# Patient Record
Sex: Female | Born: 1955 | Race: Black or African American | Hispanic: No | Smoking: Former smoker
Health system: Southern US, Community
[De-identification: ages and names within clinical notes are randomized; demographics above are authoritative.]

## PROBLEM LIST (undated history)

## (undated) DIAGNOSIS — I639 Cerebral infarction, unspecified: Secondary | ICD-10-CM

## (undated) HISTORY — PX: PERCUTANEOUS ENDOSCOPIC GASTROSTOMY (PEG) REMOVAL: SHX6018

## (undated) HISTORY — PX: BREAST BIOPSY: SHX20

## (undated) HISTORY — DX: Cerebral infarction, unspecified: I63.9

## (undated) HISTORY — PX: TUBAL LIGATION: SHX77

## (undated) HISTORY — PX: PATELLA FRACTURE SURGERY: SHX735

## (undated) HISTORY — PX: CRANIECTOMY: SHX331

---

## 2000-10-03 HISTORY — PX: ORTHOPEDIC SURGERY: SHX850

## 2014-05-06 LAB — HM PAP SMEAR: HM Pap smear: NEGATIVE

## 2017-02-16 HISTORY — PX: CRANIOTOMY FOR HEMISPHERECTOMY TOTAL / PARTIAL: SUR335

## 2017-08-08 HISTORY — PX: CRANIOPLASTY: SUR330

## 2018-03-21 LAB — BASIC METABOLIC PANEL
BUN: 12 (ref 4–21)
Creatinine: 0.8 (ref 0.5–1.1)
Potassium: 4.4 (ref 3.4–5.3)
Sodium: 142 (ref 137–147)

## 2018-03-21 LAB — CBC AND DIFFERENTIAL
HCT: 39 (ref 36–46)
Hemoglobin: 12.3 (ref 12.0–16.0)
Platelets: 300 (ref 150–399)
WBC: 7.5

## 2018-03-26 LAB — BASIC METABOLIC PANEL: Glucose: 94

## 2018-10-01 ENCOUNTER — Encounter: Payer: Self-pay | Admitting: Family Medicine

## 2018-10-01 ENCOUNTER — Ambulatory Visit: Payer: Self-pay | Admitting: Family Medicine

## 2018-10-01 VITALS — BP 138/88 | HR 83 | Temp 97.6°F | Ht 65.0 in | Wt 111.5 lb

## 2018-10-01 DIAGNOSIS — G8190 Hemiplegia, unspecified affecting unspecified side: Secondary | ICD-10-CM | POA: Insufficient documentation

## 2018-10-01 DIAGNOSIS — E119 Type 2 diabetes mellitus without complications: Secondary | ICD-10-CM

## 2018-10-01 DIAGNOSIS — R531 Weakness: Secondary | ICD-10-CM

## 2018-10-01 DIAGNOSIS — Z8673 Personal history of transient ischemic attack (TIA), and cerebral infarction without residual deficits: Secondary | ICD-10-CM

## 2018-10-01 DIAGNOSIS — I10 Essential (primary) hypertension: Secondary | ICD-10-CM

## 2018-10-01 DIAGNOSIS — I152 Hypertension secondary to endocrine disorders: Secondary | ICD-10-CM | POA: Insufficient documentation

## 2018-10-01 DIAGNOSIS — H00011 Hordeolum externum right upper eyelid: Secondary | ICD-10-CM

## 2018-10-01 DIAGNOSIS — Z7189 Other specified counseling: Secondary | ICD-10-CM | POA: Insufficient documentation

## 2018-10-01 DIAGNOSIS — I69320 Aphasia following cerebral infarction: Secondary | ICD-10-CM | POA: Insufficient documentation

## 2018-10-01 DIAGNOSIS — E1169 Type 2 diabetes mellitus with other specified complication: Secondary | ICD-10-CM | POA: Insufficient documentation

## 2018-10-01 NOTE — Assessment & Plan Note (Signed)
Brief discussion regarding wishes for cancer screening. Declined cervical cancer, breast cancer, and colon cancer screening. Will plan to make sure patient has an advanced directive and continue the discussion at next office visit.

## 2018-10-01 NOTE — Assessment & Plan Note (Signed)
BP ok today. Will reviewed neurology notes and prior PCP. May need to have stricter goal.

## 2018-10-01 NOTE — Assessment & Plan Note (Signed)
Stroke with lingering Right UE weakness and RLE requiring brace. And expressive aphasia. Will follow-up outside records.

## 2018-10-01 NOTE — Progress Notes (Signed)
Subjective:     Andrea [name] is a 62 y.o. female presenting for Establish Care (previous P[place] Josepha [place]. last visit there was in July 2019.) and Stye (right eye. better than 2 weeks ago. has applied ointment to the area.)     HPI  #Hx of CVA - Feb 13, 2017 - Lives with daughter - minimal movement in right arm - dysphagia/aphasia - right brace on right leg - using cane to walk - is on amantadine which has helped with aphasia - communication is improving through   Was on warfarin initially - though complicated by vaginal bleeding - procedure by OB/GYN  Had a neurologist for a few months -- final appointment was on as needed bases -- 25% damage on the left side  Seems to be more tired over the last week Did not want to get out bed  Gave her the stool softener - started to feel better after going to the bathroom  Is on a more regular diet now   Review of Systems  Respiratory: Negative for choking and shortness of breath.   Psychiatric/Behavioral: Negative for dysphoric mood. The patient is not nervous/anxious.      Social History   Tobacco Use  Smoking Status Former Smoker  . Last attempt to quit: 12/2016  . Years since quitting: 1.8  Smokeless Tobacco Never Used        Objective:    BP Readings from Last 3 Encounters:  10/01/18 138/88   Wt Readings from Last 3 Encounters:  10/01/18 111 lb 8 oz (50.6 kg)    BP 138/88   Pulse 83   Temp 97.6 F (36.4 C)   Ht 5\' 5"  (1.651 m)   Wt 111 lb 8 oz (50.6 kg)   SpO2 98%   BMI 18.55 kg/m    Physical Exam Constitutional:      Appearance: She is normal weight.  HENT:     Head:     Comments: Head healed though with asymmetric shape in setting of craniotomy     Nose: Nose normal.     Mouth/Throat:     Mouth: Mucous membranes are moist.  Eyes:     Conjunctiva/sclera: Conjunctivae normal.     Comments: Right upper lid with area of fullness. Not tender to palpation  Cardiovascular:    Rate and Rhythm: Normal rate and regular rhythm.     Heart sounds: No murmur.  Pulmonary:     Effort: Pulmonary effort is normal.     Breath sounds: Normal breath sounds.  Neurological:     Mental Status: She is alert. Mental status is at baseline.     Comments: Right arm with partially closed fist and resting at side. She is able to lift the shoulder partially. But unable to close/open fist or straighten arm. RLE with good knee flexion/extension 4/5 to 5/5 with resistance. Normal strength of LUE  Psychiatric:     Comments: Answers questions with nods.            Assessment & Plan:   Problem List Items Addressed This Visit      Cardiovascular and Mediastinum   Essential hypertension    BP ok today. Will reviewed neurology notes and prior PCP. May need to have stricter goal.       Relevant Medications   aspirin EC 81 MG tablet   metoprolol tartrate (LOPRESSOR) 25 MG tablet   hydrALAZINE (APRESOLINE) 10 MG tablet   amLODipine (NORVASC) 10 MG  tablet   pravastatin (PRAVACHOL) 80 MG tablet     Endocrine   Diabetes (HCC)    Per daughter last checked HgbA1c ~6 range on metformin. Continue meds, review outside records.       Relevant Medications   metFORMIN (GLUCOPHAGE) 850 MG tablet   aspirin EC 81 MG tablet   pravastatin (PRAVACHOL) 80 MG tablet     Nervous and Auditory   Right sided weakness     Other   History of stroke - Primary    Stroke with lingering Right UE weakness and RLE requiring brace. And expressive aphasia. Will follow-up outside records.       Aphasia as late effect of stroke   Counseling regarding goals of care    Brief discussion regarding wishes for cancer screening. Declined cervical cancer, breast cancer, and colon cancer screening. Will plan to make sure patient has an advanced directive and continue the discussion at next office visit.        Other Visit Diagnoses    Hordeolum externum of right upper eyelid       Relevant Orders    Ambulatory referral to Ophthalmology     Hordeolum which is not resolved in 2 weeks. Will refer for evaluation to see if removal is indicated. Continue warm compress  Return in about 3 months (around 12/31/2018).  Lynnda ChildJessica R Darrious Youman, MD

## 2018-10-01 NOTE — Assessment & Plan Note (Signed)
Per daughter last checked HgbA1c ~6 range on metformin. Continue meds, review outside records.

## 2018-10-01 NOTE — Patient Instructions (Signed)
#  Stye - continue to do Warm compresses -- try to do this 4 times a day - if not improving -- go to the eye specialist  It was great to meet you today!   I will work on getting your records and plan to see you back in about 3-4 months to check in.   Let me know if you need anything in between.

## 2018-10-08 ENCOUNTER — Telehealth: Payer: Self-pay

## 2018-10-08 NOTE — Telephone Encounter (Signed)
Greatly appreciate the support.

## 2018-10-08 NOTE — Telephone Encounter (Signed)
Spoke with patient's daughter (okay per DPR) regarding options for financial assistance.  In particular,  1.  [place] assistance program to assist with medical bill expense - reviewed process and application over phone.  Application with instructions/criteria placed in the mail for them to complete and return.  2.  [place] medication management clinic - reviewed program specifics.  Patient's daughter states that currently they are able to afford all of her medications through [facility] but will keep this option in mind if needed in the future.  3.  Discussed patients application for Medicaid which recently was denied.  Daughter had been trying to apply online on her own with much confusion and difficulty.  I gave her direct contact info for [employer] social services to connect with a Child psychotherapist who could be their advocate and help them reapply and/or look at other support options.  Daughter thanked me for all of the helpful information and has my name and number should she have any further questions.  Thanks.

## 2018-12-31 ENCOUNTER — Other Ambulatory Visit: Payer: Self-pay

## 2018-12-31 ENCOUNTER — Encounter: Payer: Self-pay | Admitting: Family Medicine

## 2018-12-31 ENCOUNTER — Ambulatory Visit (INDEPENDENT_AMBULATORY_CARE_PROVIDER_SITE_OTHER): Payer: Self-pay | Admitting: Family Medicine

## 2018-12-31 VITALS — BP 120/71 | HR 74 | Temp 98.5°F | Ht 65.0 in

## 2018-12-31 DIAGNOSIS — I10 Essential (primary) hypertension: Secondary | ICD-10-CM

## 2018-12-31 DIAGNOSIS — E119 Type 2 diabetes mellitus without complications: Secondary | ICD-10-CM

## 2018-12-31 DIAGNOSIS — Z7189 Other specified counseling: Secondary | ICD-10-CM

## 2018-12-31 DIAGNOSIS — Z8673 Personal history of transient ischemic attack (TIA), and cerebral infarction without residual deficits: Secondary | ICD-10-CM

## 2018-12-31 NOTE — Assessment & Plan Note (Signed)
Stable on oral medication. Unable to complete foot exam today. Will get labs in 3 months

## 2018-12-31 NOTE — Assessment & Plan Note (Signed)
Discussion around what she would want done if she got sick. Would like ICU care and full code. Again declined routine preventive cancer screening

## 2018-12-31 NOTE — Progress Notes (Signed)
    I connected with@ on 12/31/18 at  3:20 PM EDT by video and verified that I am speaking with the correct person using two identifiers.   I discussed the limitations, risks, security and privacy concerns of performing an evaluation and management service by video and the availability of in person appointments. I also discussed with the patient that there may be a patient responsible charge related to this service. The patient expressed understanding and agreed to proceed.  Patient location: Home Provider Location: [place] Participants: [name] and [name] and [name]   Subjective:     Andrea [name] is a 63 y.o. female presenting for Follow-up (3 months follow up.)     HPI   #Goals of Care - Would be interested - in ventilation and CPR if needed - staying inside and keeping in touch with family   #DM - Hemoglobin a1c was checked in June 2019 and was around 6   #Post Stroke - continues to work on movement - level is about the same - Walking around the house 4 times in the morning and afternoon   Review of Systems  Constitutional: Negative for chills and fever.  Eyes: Negative for visual disturbance.  Respiratory: Negative for cough and shortness of breath.   Cardiovascular: Negative for chest pain.  Endocrine: Negative for polydipsia and polyuria.  Neurological: Negative for headaches.     Social History   Tobacco Use  Smoking Status Former Smoker  . Last attempt to quit: 12/2016  . Years since quitting: 2.0  Smokeless Tobacco Never Used        Objective:   BP Readings from Last 3 Encounters:  12/31/18 120/71  10/01/18 138/88   Wt Readings from Last 3 Encounters:  10/01/18 111 lb 8 oz (50.6 kg)     BP 120/71 Comment: per patient's b/p machine at home  Pulse 74 Comment: per patient's b/p machine at home  Temp 98.5 F (36.9 C) Comment: per patient's thermometer  Ht 5\' 5"  (1.651 m)   BMI 18.55 kg/m    Right arm is  at side. Daughter responds to most questions and helps readdress question Mostly answers yes or no      Assessment & Plan:   Problem List Items Addressed This Visit      Cardiovascular and Mediastinum   Essential hypertension - Primary    BP at goal, continue current medications      Relevant Orders   Comprehensive metabolic panel   CBC     Endocrine   Diabetes (HCC)    Stable on oral medication. Unable to complete foot exam today. Will get labs in 3 months      Relevant Orders   Hemoglobin A1c     Other   History of stroke    Stable and remaining active      Relevant Orders   Hemoglobin A1c   Lipid panel   Advanced care planning/counseling discussion    Discussion around what she would want done if she got sick. Would like ICU care and full code. Again declined routine preventive cancer screening          Return in about 3 months (around 04/02/2019).  [name], MD

## 2018-12-31 NOTE — Assessment & Plan Note (Signed)
Stable and remaining active

## 2018-12-31 NOTE — Assessment & Plan Note (Signed)
BP at goal, continue current medications 

## 2019-01-07 ENCOUNTER — Encounter: Payer: Self-pay | Admitting: Family Medicine

## 2019-01-07 DIAGNOSIS — Z8709 Personal history of other diseases of the respiratory system: Secondary | ICD-10-CM | POA: Insufficient documentation

## 2019-01-07 DIAGNOSIS — J45909 Unspecified asthma, uncomplicated: Secondary | ICD-10-CM | POA: Insufficient documentation

## 2019-01-07 DIAGNOSIS — E785 Hyperlipidemia, unspecified: Secondary | ICD-10-CM | POA: Insufficient documentation

## 2019-01-07 DIAGNOSIS — E1169 Type 2 diabetes mellitus with other specified complication: Secondary | ICD-10-CM | POA: Insufficient documentation

## 2019-04-11 ENCOUNTER — Telehealth: Payer: Self-pay | Admitting: Family Medicine

## 2019-04-11 MED ORDER — PRAVASTATIN SODIUM 80 MG PO TABS: 80.0000 mg | ORAL_TABLET | Freq: Every day | ORAL | 5 refills | Status: DC

## 2019-04-11 MED ORDER — METFORMIN HCL 850 MG PO TABS: 850.0000 mg | ORAL_TABLET | Freq: Every day | ORAL | 5 refills | Status: DC

## 2019-04-11 MED ORDER — AMLODIPINE BESYLATE 10 MG PO TABS: 10.0000 mg | ORAL_TABLET | Freq: Every day | ORAL | 5 refills | Status: DC

## 2019-04-11 NOTE — Telephone Encounter (Signed)
Prescriptions refilled and appointment for follow up made. Daughter advised.

## 2019-04-11 NOTE — Telephone Encounter (Signed)
Patient's daughter called.  She called [facility] [address] to get refills on patient's medications.  They said they couldn't refill the medications because they were under her former doctor.  Patient needs a new rx for Metformin 850 mg , Pravastatin 80 mg, and Amlodipine 10 mg sent to [facility] [address]Marland KitchenMarland Kitchen

## 2019-04-15 ENCOUNTER — Ambulatory Visit: Payer: Self-pay | Admitting: Family Medicine

## 2019-04-15 ENCOUNTER — Encounter: Payer: Self-pay | Admitting: Family Medicine

## 2019-04-15 ENCOUNTER — Other Ambulatory Visit: Payer: Self-pay

## 2019-04-15 VITALS — BP 148/72 | HR 81 | Temp 98.0°F | Resp 18 | Ht 65.0 in | Wt 112.5 lb

## 2019-04-15 DIAGNOSIS — I10 Essential (primary) hypertension: Secondary | ICD-10-CM

## 2019-04-15 DIAGNOSIS — E119 Type 2 diabetes mellitus without complications: Secondary | ICD-10-CM

## 2019-04-15 DIAGNOSIS — Z8673 Personal history of transient ischemic attack (TIA), and cerebral infarction without residual deficits: Secondary | ICD-10-CM

## 2019-04-15 DIAGNOSIS — E785 Hyperlipidemia, unspecified: Secondary | ICD-10-CM

## 2019-04-15 MED ORDER — AMANTADINE HCL 100 MG PO CAPS: 100.0000 mg | ORAL_CAPSULE | Freq: Two times a day (BID) | ORAL | 3 refills | Status: DC

## 2019-04-15 MED ORDER — METOPROLOL TARTRATE 25 MG PO TABS: 12.5000 mg | ORAL_TABLET | Freq: Two times a day (BID) | ORAL | 3 refills | Status: DC

## 2019-04-15 MED ORDER — PRAVASTATIN SODIUM 80 MG PO TABS: 80.0000 mg | ORAL_TABLET | Freq: Every day | ORAL | 3 refills | Status: DC

## 2019-04-15 MED ORDER — METFORMIN HCL 850 MG PO TABS: 850.0000 mg | ORAL_TABLET | Freq: Every day | ORAL | 3 refills | Status: DC

## 2019-04-15 MED ORDER — AMLODIPINE BESYLATE 10 MG PO TABS: 10.0000 mg | ORAL_TABLET | Freq: Every day | ORAL | 3 refills | Status: DC

## 2019-04-15 MED ORDER — HYDRALAZINE HCL 10 MG PO TABS: 20.0000 mg | ORAL_TABLET | Freq: Three times a day (TID) | ORAL | 3 refills | Status: DC | PRN

## 2019-04-15 NOTE — Assessment & Plan Note (Signed)
BP mildly elevated today, but with good control at home. Cont current medications and home monitoring

## 2019-04-15 NOTE — Patient Instructions (Addendum)
#   Diabetes - continue metformin - labs today  # High blood pressure - keep watching at home - continue medications

## 2019-04-15 NOTE — Progress Notes (Signed)
Subjective:     Andrea [name] is a 63 y.o. female presenting for Diabetes (follow up)     HPI   #Blood pressure - check at home 128-136 - did have some pork ribs this weekend which may have   #Diabetes - taking metformin - no diarrhea to metformin - pneumonia shot during hospital  #prior stroke - still with some aphasia, but working on yes/no answers - amantadine helps with day/night regulation  -   Review of Systems  Respiratory: Negative for shortness of breath.   Cardiovascular: Negative for chest pain.  Neurological: Negative for headaches.    12/31/2018: Virtual visit - bp at goal, diabetes stable, declined cancer screening  Social History   Tobacco Use  Smoking Status Former Smoker   Quit date: 12/2016   Years since quitting: 2.3  Smokeless Tobacco Never Used        Objective:    BP Readings from Last 3 Encounters:  04/15/19 (!) 148/72  12/31/18 120/71  10/01/18 138/88   Wt Readings from Last 3 Encounters:  04/15/19 112 lb 8 oz (51 kg)  10/01/18 111 lb 8 oz (50.6 kg)    BP (!) 148/72    Pulse 81    Temp 98 F (36.7 C)    Resp 18    Ht 5\' 5"  (1.651 m)    Wt 112 lb 8 oz (51 kg)    SpO2 98%    BMI 18.72 kg/m    Physical Exam Constitutional:      General: She is not in acute distress.    Appearance: She is well-developed. She is not diaphoretic.  HENT:     Right Ear: External ear normal.     Left Ear: External ear normal.     Nose: Nose normal.  Eyes:     Conjunctiva/sclera: Conjunctivae normal.  Neck:     Musculoskeletal: Neck supple.  Cardiovascular:     Rate and Rhythm: Normal rate.  Pulmonary:     Effort: Pulmonary effort is normal.  Skin:    General: Skin is warm and dry.     Capillary Refill: Capillary refill takes less than 2 seconds.  Neurological:     Mental Status: She is alert. Mental status is at baseline.     Comments: Right arm still at side. Right leg with brace in place. Able to take off brace/shoes/socks with left  hand.    Psychiatric:        Mood and Affect: Mood normal.        Behavior: Behavior normal.    Diabetic Foot Exam - Simple   Simple Foot Form Diabetic Foot exam was performed with the following findings: Yes 04/15/2019  3:16 PM  Visual Inspection No deformities, no ulcerations, no other skin breakdown bilaterally: Yes Sensation Testing Intact to touch and monofilament testing bilaterally: Yes Pulse Check Posterior Tibialis and Dorsalis pulse intact bilaterally: Yes Comments            Assessment & Plan:   Problem List Items Addressed This Visit      Cardiovascular and Mediastinum   Essential hypertension - Primary    BP mildly elevated today, but with good control at home. Cont current medications and home monitoring      Relevant Medications   amLODipine (NORVASC) 10 MG tablet   hydrALAZINE (APRESOLINE) 10 MG tablet   metoprolol tartrate (LOPRESSOR) 25 MG tablet   pravastatin (PRAVACHOL) 80 MG tablet   Other Relevant Orders   Comprehensive metabolic panel  CBC     Endocrine   Diabetes ([place])    Per report, was improving after stroke due to weight loss. Will repeat HgbA1c today      Relevant Medications   metFORMIN (GLUCOPHAGE) 850 MG tablet   pravastatin (PRAVACHOL) 80 MG tablet   Other Relevant Orders   Hemoglobin A1c     Other   History of stroke    Cont amantadine which seems to helping with sleep/wake cycles      Relevant Medications   amantadine (SYMMETREL) 100 MG capsule   HLD (hyperlipidemia)    Will repeat lipids today. Continue statin medication      Relevant Medications   amLODipine (NORVASC) 10 MG tablet   hydrALAZINE (APRESOLINE) 10 MG tablet   metoprolol tartrate (LOPRESSOR) 25 MG tablet   pravastatin (PRAVACHOL) 80 MG tablet   Other Relevant Orders   Lipid panel       Return in about 6 months (around 10/16/2019).  Lesleigh Noe, MD

## 2019-04-15 NOTE — Assessment & Plan Note (Signed)
Will repeat lipids today. Continue statin medication

## 2019-04-15 NOTE — Assessment & Plan Note (Signed)
Per report, was improving after stroke due to weight loss. Will repeat HgbA1c today

## 2019-04-15 NOTE — Assessment & Plan Note (Signed)
Cont amantadine which seems to helping with sleep/wake cycles

## 2019-04-16 ENCOUNTER — Encounter: Payer: Self-pay | Admitting: Family Medicine

## 2019-04-16 LAB — HEMOGLOBIN A1C: Hgb A1c MFr Bld: 5.9 % (ref 4.6–6.5)

## 2019-04-16 LAB — COMPREHENSIVE METABOLIC PANEL
ALT: 13 U/L (ref 0–35)
AST: 15 U/L (ref 0–37)
Albumin: 4.7 g/dL (ref 3.5–5.2)
Alkaline Phosphatase: 54 U/L (ref 39–117)
BUN: 18 mg/dL (ref 6–23)
CO2: 31 mEq/L (ref 19–32)
Calcium: 10.3 mg/dL (ref 8.4–10.5)
Chloride: 103 mEq/L (ref 96–112)
Creatinine, Ser: 0.88 mg/dL (ref 0.40–1.20)
GFR: 78.91 mL/min (ref >60.00)
Glucose, Bld: 120 mg/dL — ABNORMAL HIGH (ref 70–99)
Potassium: 3.9 mEq/L (ref 3.5–5.1)
Sodium: 142 mEq/L (ref 135–145)
Total Bilirubin: 0.5 mg/dL (ref 0.2–1.2)
Total Protein: 7.9 g/dL (ref 6.0–8.3)

## 2019-04-16 LAB — CBC
HCT: 39.2 % (ref 36.0–46.0)
Hemoglobin: 12.5 g/dL (ref 12.0–15.0)
MCHC: 32 g/dL (ref 30.0–36.0)
MCV: 86.2 fl (ref 78.0–100.0)
Platelets: 344 10*3/uL (ref 150.0–400.0)
RBC: 4.55 Mil/uL (ref 3.87–5.11)
RDW: 14.2 % (ref 11.5–15.5)
WBC: 6.8 10*3/uL (ref 4.0–10.5)

## 2019-04-16 LAB — LIPID PANEL
Cholesterol: 139 mg/dL (ref 0–200)
HDL: 58.4 mg/dL (ref >39.00)
LDL Cholesterol: 58 mg/dL (ref 0–99)
NonHDL: 80.92
Total CHOL/HDL Ratio: 2
Triglycerides: 115 mg/dL (ref 0.0–149.0)
VLDL: 23 mg/dL (ref 0.0–40.0)

## 2019-10-17 ENCOUNTER — Ambulatory Visit (INDEPENDENT_AMBULATORY_CARE_PROVIDER_SITE_OTHER): Payer: Medicare Other | Admitting: Family Medicine

## 2019-10-17 ENCOUNTER — Other Ambulatory Visit: Payer: Self-pay

## 2019-10-17 ENCOUNTER — Encounter: Payer: Self-pay | Admitting: Family Medicine

## 2019-10-17 VITALS — BP 148/76 | HR 80 | Temp 97.1°F | Ht 65.0 in | Wt 119.2 lb

## 2019-10-17 DIAGNOSIS — E119 Type 2 diabetes mellitus without complications: Secondary | ICD-10-CM | POA: Diagnosis not present

## 2019-10-17 DIAGNOSIS — I10 Essential (primary) hypertension: Secondary | ICD-10-CM | POA: Diagnosis not present

## 2019-10-17 DIAGNOSIS — R3981 Functional urinary incontinence: Secondary | ICD-10-CM | POA: Diagnosis not present

## 2019-10-17 LAB — POCT GLYCOSYLATED HEMOGLOBIN (HGB A1C): Hemoglobin A1C: 5.4 % (ref 4.0–5.6)

## 2019-10-17 NOTE — Progress Notes (Signed)
Subjective:     Andrea [name] is a 64 y.o. female presenting for Follow-up (6 months)     HPI  #HTN - checking about 1 time a week - 118/78-80 -    Feeling fine in general  #Urine incontinence Doing more bedwetting at night Has tried to do bedside commode Has been urinating in bed daily now During the day is continent of bladder -- will get up to the bathroom - when she wakes up she is already wet - endorses waking up feeling like she needs to urinate  #Stroke - has a desk exercise and doing that a few times a week - right sided weakness   Review of Systems   Social History   Tobacco Use  Smoking Status Former Smoker  . Quit date: 12/2016  . Years since quitting: 2.8  Smokeless Tobacco Never Used        Objective:    BP Readings from Last 3 Encounters:  10/17/19 (!) 148/76  04/15/19 (!) 148/72  12/31/18 120/71   Wt Readings from Last 3 Encounters:  10/17/19 119 lb 4 oz (54.1 kg)  04/15/19 112 lb 8 oz (51 kg)  10/01/18 111 lb 8 oz (50.6 kg)    BP (!) 148/76   Pulse 80   Temp (!) 97.1 F (36.2 C)   Ht 5\' 5"  (1.651 m)   Wt 119 lb 4 oz (54.1 kg)   SpO2 98%   BMI 19.84 kg/m    Physical Exam Constitutional:      General: She is not in acute distress.    Appearance: She is well-developed. She is not diaphoretic.  HENT:     Right Ear: External ear normal.     Left Ear: External ear normal.     Nose: Nose normal.  Eyes:     Conjunctiva/sclera: Conjunctivae normal.  Cardiovascular:     Rate and Rhythm: Normal rate and regular rhythm.     Heart sounds: No murmur.  Pulmonary:     Effort: Pulmonary effort is normal. No respiratory distress.     Breath sounds: Normal breath sounds. No wheezing.  Musculoskeletal:     Cervical back: Neck supple.  Skin:    General: Skin is warm and dry.     Capillary Refill: Capillary refill takes less than 2 seconds.  Neurological:     Mental Status: She is alert. Mental status is at baseline.     Comments:  Right sided hemiparesis. Short yes/no answers  Psychiatric:        Mood and Affect: Mood normal.        Behavior: Behavior normal.      Lab Results  Component Value Date   HGBA1C 5.4 10/17/2019        Assessment & Plan:   Problem List Items Addressed This Visit      Cardiovascular and Mediastinum   Essential hypertension    Stable on home monitoring. Cont medications        Endocrine   Diabetes (HCC) - Primary    Hgba1c now 5.4. Advised stopping metformin and repeat labs in 3 months. Referral for eye exam.       Relevant Orders   HgB A1c (Completed)   Ambulatory referral to Ophthalmology     Other   Urinary incontinence due to immobility    Mostly at night due to mobility difficulty, they will use bedside commode to see if that helps and try to slowly progress as tolerated toward going to the  bathroom.           Return in about 6 months (around 04/15/2020).  Lesleigh Noe, MD

## 2019-10-17 NOTE — Patient Instructions (Addendum)
Blood pressure - continue your current medicine  Diabetes - will check labs today - eye exam  Urinary issues - try doing bedside commode at night - slowly move further from bed as tolerated

## 2019-10-17 NOTE — Assessment & Plan Note (Signed)
Hgba1c now 5.4. Advised stopping metformin and repeat labs in 3 months. Referral for eye exam.

## 2019-10-17 NOTE — Assessment & Plan Note (Signed)
Stable on home monitoring. Cont medications

## 2019-10-17 NOTE — Assessment & Plan Note (Signed)
Mostly at night due to mobility difficulty, they will use bedside commode to see if that helps and try to slowly progress as tolerated toward going to the bathroom.

## 2020-01-17 LAB — HM DIABETES EYE EXAM

## 2020-01-21 ENCOUNTER — Ambulatory Visit: Payer: Medicare Other | Admitting: Family Medicine

## 2020-01-23 ENCOUNTER — Ambulatory Visit: Payer: Medicare Other | Admitting: Family Medicine

## 2020-01-30 ENCOUNTER — Encounter: Payer: Self-pay | Admitting: Ophthalmology

## 2020-01-30 ENCOUNTER — Ambulatory Visit (INDEPENDENT_AMBULATORY_CARE_PROVIDER_SITE_OTHER): Payer: Medicare Other | Admitting: Family Medicine

## 2020-01-30 ENCOUNTER — Encounter: Payer: Self-pay | Admitting: Family Medicine

## 2020-01-30 ENCOUNTER — Other Ambulatory Visit: Payer: Self-pay

## 2020-01-30 VITALS — BP 164/82 | HR 87 | Temp 98.3°F | Ht 65.0 in | Wt 126.5 lb

## 2020-01-30 DIAGNOSIS — E119 Type 2 diabetes mellitus without complications: Secondary | ICD-10-CM

## 2020-01-30 DIAGNOSIS — E785 Hyperlipidemia, unspecified: Secondary | ICD-10-CM

## 2020-01-30 DIAGNOSIS — I10 Essential (primary) hypertension: Secondary | ICD-10-CM | POA: Diagnosis not present

## 2020-01-30 DIAGNOSIS — Z8673 Personal history of transient ischemic attack (TIA), and cerebral infarction without residual deficits: Secondary | ICD-10-CM | POA: Diagnosis not present

## 2020-01-30 LAB — POCT GLYCOSYLATED HEMOGLOBIN (HGB A1C): Hemoglobin A1C: 6.2 % — AB (ref 4.0–5.6)

## 2020-01-30 MED ORDER — HYDRALAZINE HCL 10 MG PO TABS: 20.0000 mg | ORAL_TABLET | Freq: Three times a day (TID) | ORAL | 3 refills | Status: DC | PRN

## 2020-01-30 MED ORDER — METOPROLOL TARTRATE 25 MG PO TABS: 12.5000 mg | ORAL_TABLET | Freq: Two times a day (BID) | ORAL | 3 refills | Status: DC

## 2020-01-30 MED ORDER — METFORMIN HCL 850 MG PO TABS: 850.0000 mg | ORAL_TABLET | Freq: Every day | ORAL | 3 refills | Status: DC

## 2020-01-30 MED ORDER — PRAVASTATIN SODIUM 80 MG PO TABS: 80.0000 mg | ORAL_TABLET | Freq: Every day | ORAL | 3 refills | Status: DC

## 2020-01-30 MED ORDER — AMANTADINE HCL 100 MG PO CAPS: 100.0000 mg | ORAL_CAPSULE | Freq: Two times a day (BID) | ORAL | 3 refills | Status: DC

## 2020-01-30 MED ORDER — AMLODIPINE BESYLATE 10 MG PO TABS: 10.0000 mg | ORAL_TABLET | Freq: Every day | ORAL | 3 refills | Status: DC

## 2020-01-30 NOTE — Assessment & Plan Note (Addendum)
BP elevated. Will do home monitoring and mychart in 3 weeks. If still elevated will plan to adjust medications and f/u. Cont metoprolol, amlodipin, and hydralazine. Anticipate adding ACE if tolerated

## 2020-01-30 NOTE — Patient Instructions (Addendum)
Your blood pressure high.   High blood pressure increases your risk for heart attack and stroke.    Please check your blood pressure 2-4 times a week.   To check your blood pressure 1) Sit in a quiet and relaxed place for 5 minutes 2) Make sure your feet are flat on the ground 3) Consider checking first thing in the morning   Normal blood pressure is less than 140/90 Ideally you blood pressure should be around 120/80   MyChart message in 2-3 weeks with her home blood pressure -- plan follow-up based on those values  Diabetes - remain off metformin

## 2020-01-30 NOTE — Assessment & Plan Note (Signed)
Hgb A1c remains controlled off medication. Remain diet controlled. Repeat in 6 months.

## 2020-01-30 NOTE — Progress Notes (Signed)
Subjective:     Andrea [name] is a 64 y.o. female presenting for Diabetes (follow up)     HPI   #Diabetes - no longer taking metformin as planned   #HTN - has not been checking BP at home - family has not been checking x 1 month - no cp, vision changes, breathing issues, no headaches  #Limited mobility - not exercising or walking at home - does OT  - will do her own bath - have not been able to do the bedside commode - still urinating in the bed at night - did have some difficulty lifting her leg - still trying to work through - still having some difficulty with communication and with mobility  #Prior stroke - was seeing neurology - has not seen then since moving from [place] - and was released - still does OT - not interested in re-engaging in SLP or PT  Review of Systems  10/17/2019: Clinic - DM - Hgb a1c 5.4 - stop metformin. HTN - high, stable at home.  Social History   Tobacco Use  Smoking Status Former Smoker  . Quit date: 12/2016  . Years since quitting: 3.1  Smokeless Tobacco Never Used        Objective:    BP Readings from Last 3 Encounters:  01/30/20 (!) 164/82  10/17/19 (!) 148/76  04/15/19 (!) 148/72   Wt Readings from Last 3 Encounters:  01/30/20 126 lb 8 oz (57.4 kg)  10/17/19 119 lb 4 oz (54.1 kg)  04/15/19 112 lb 8 oz (51 kg)    BP (!) 164/82   Pulse 87   Temp 98.3 F (36.8 C)   Ht 5\' 5"  (1.651 m)   Wt 126 lb 8 oz (57.4 kg)   SpO2 95%   BMI 21.05 kg/m    Physical Exam Constitutional:      General: She is not in acute distress.    Appearance: She is well-developed. She is not diaphoretic.  HENT:     Right Ear: External ear normal.     Left Ear: External ear normal.  Eyes:     Conjunctiva/sclera: Conjunctivae normal.  Cardiovascular:     Rate and Rhythm: Normal rate and regular rhythm.     Heart sounds: No murmur.  Pulmonary:     Effort: Pulmonary effort is normal. No respiratory distress.     Breath sounds:  Normal breath sounds. No wheezing.  Musculoskeletal:     Cervical back: Neck supple.     Comments: Right LE in supportive shoe brace. Right UE in rested position  Skin:    General: Skin is warm and dry.     Capillary Refill: Capillary refill takes less than 2 seconds.  Neurological:     Mental Status: She is alert. Mental status is at baseline.     Comments: Intermittent expressive aphasia.   Psychiatric:        Mood and Affect: Mood normal.        Behavior: Behavior normal.           Assessment & Plan:   Problem List Items Addressed This Visit      Cardiovascular and Mediastinum   Essential hypertension    BP elevated. Will do home monitoring and mychart in 3 weeks. If still elevated will plan to adjust medications and f/u. Cont metoprolol, amlodipin, and hydralazine. Anticipate adding ACE if tolerated      Relevant Medications   amLODipine (NORVASC) 10 MG tablet   hydrALAZINE (APRESOLINE)  10 MG tablet   metoprolol tartrate (LOPRESSOR) 25 MG tablet   pravastatin (PRAVACHOL) 80 MG tablet     Endocrine   Diabetes (HCC) - Primary    Hgb A1c remains controlled off medication. Remain diet controlled. Repeat in 6 months.       Relevant Medications   metFORMIN (GLUCOPHAGE) 850 MG tablet   pravastatin (PRAVACHOL) 80 MG tablet   Other Relevant Orders   POCT glycosylated hemoglobin (Hb A1C) (Completed)     Other   History of stroke    Continues to make small improvements at home. Declined re-referral to SLP or PT but works on OT. Now bathing herself, though it takes a long time. Reach out if she would like to return to therapy.       Relevant Medications   amantadine (SYMMETREL) 100 MG capsule   Other Relevant Orders   Lipid panel   Comprehensive metabolic panel   HLD (hyperlipidemia)   Relevant Medications   amLODipine (NORVASC) 10 MG tablet   hydrALAZINE (APRESOLINE) 10 MG tablet   metoprolol tartrate (LOPRESSOR) 25 MG tablet   pravastatin (PRAVACHOL) 80 MG  tablet   Other Relevant Orders   Lipid panel       Return in about 6 months (around 07/31/2020).  [name], MD

## 2020-01-30 NOTE — Assessment & Plan Note (Signed)
Continues to make small improvements at home. Declined re-referral to SLP or PT but works on OT. Now bathing herself, though it takes a long time. Reach out if she would like to return to therapy.

## 2020-01-31 LAB — COMPREHENSIVE METABOLIC PANEL
ALT: 18 U/L (ref 0–35)
AST: 18 U/L (ref 0–37)
Albumin: 4.6 g/dL (ref 3.5–5.2)
Alkaline Phosphatase: 60 U/L (ref 39–117)
BUN: 19 mg/dL (ref 6–23)
CO2: 29 mEq/L (ref 19–32)
Calcium: 10.4 mg/dL (ref 8.4–10.5)
Chloride: 105 mEq/L (ref 96–112)
Creatinine, Ser: 0.99 mg/dL (ref 0.40–1.20)
GFR: 68.7 mL/min (ref >60.00)
Glucose, Bld: 137 mg/dL — ABNORMAL HIGH (ref 70–99)
Potassium: 4.3 mEq/L (ref 3.5–5.1)
Sodium: 143 mEq/L (ref 135–145)
Total Bilirubin: 0.4 mg/dL (ref 0.2–1.2)
Total Protein: 8 g/dL (ref 6.0–8.3)

## 2020-01-31 LAB — LIPID PANEL
Cholesterol: 162 mg/dL (ref 0–200)
HDL: 62.7 mg/dL (ref >39.00)
LDL Cholesterol: 80 mg/dL (ref 0–99)
NonHDL: 99.69
Total CHOL/HDL Ratio: 3
Triglycerides: 100 mg/dL (ref 0.0–149.0)
VLDL: 20 mg/dL (ref 0.0–40.0)

## 2020-08-20 ENCOUNTER — Ambulatory Visit: Payer: Medicare Other | Admitting: Family Medicine

## 2020-08-31 ENCOUNTER — Ambulatory Visit (INDEPENDENT_AMBULATORY_CARE_PROVIDER_SITE_OTHER): Payer: Medicare Other | Admitting: Family Medicine

## 2020-08-31 ENCOUNTER — Other Ambulatory Visit: Payer: Self-pay

## 2020-08-31 VITALS — BP 150/82 | HR 76 | Temp 97.8°F | Ht 65.0 in | Wt 137.5 lb

## 2020-08-31 DIAGNOSIS — E1169 Type 2 diabetes mellitus with other specified complication: Secondary | ICD-10-CM

## 2020-08-31 DIAGNOSIS — I10 Essential (primary) hypertension: Secondary | ICD-10-CM | POA: Diagnosis not present

## 2020-08-31 DIAGNOSIS — I69320 Aphasia following cerebral infarction: Secondary | ICD-10-CM

## 2020-08-31 DIAGNOSIS — E119 Type 2 diabetes mellitus without complications: Secondary | ICD-10-CM

## 2020-08-31 DIAGNOSIS — Z8673 Personal history of transient ischemic attack (TIA), and cerebral infarction without residual deficits: Secondary | ICD-10-CM

## 2020-08-31 DIAGNOSIS — R531 Weakness: Secondary | ICD-10-CM

## 2020-08-31 LAB — POCT GLYCOSYLATED HEMOGLOBIN (HGB A1C): Hemoglobin A1C: 6.8 % — AB (ref 4.0–5.6)

## 2020-08-31 NOTE — Assessment & Plan Note (Signed)
Complicated by right sided weakness, aphasia, and slow movement. She is on amantadine post stroke and overall are noticing some improvement. She has not seen Neurology since her stroke. Will refer to local neurologist to have continued support. Encouraged her to keep up with home PT. Cont amantadine 100 mg BID, pravastatin 80 mg and ASA 81 mg. HTN management.

## 2020-08-31 NOTE — Assessment & Plan Note (Signed)
Worsening hgb a1c but still diet controlled. Complicated by HTN, HLD, prior stroke. Continue diabetic diet. Home CBG monitoring - call if elevated and may restart metformin.

## 2020-08-31 NOTE — Patient Instructions (Addendum)
Check home blood pressure for the next week  Send MyChart message in 1 week with home monitoring blood pressure   Check blood glucose occasionally   Fasting glucose: <140 Post meals 2 hours: <160  #Referral I have placed a referral to a specialist for you. You should receive a phone call from the specialty office. Make sure your voicemail is not full and that if you are able to answer your phone to unknown or new numbers.   It may take up to 2 weeks to hear about the referral. If you do not hear anything in 2 weeks, please call our office and ask to speak with the referral coordinator.

## 2020-08-31 NOTE — Assessment & Plan Note (Signed)
BP elevated again. Not regularly checking at home, but reports normal number. Cont metoprolol 12.5 BID, hydralazine 20 mg q8h, and amlodipine 10 mg. They will call in 1 week with home monitoring. May consider ACE-I or HCTZ if still elevated.

## 2020-08-31 NOTE — Progress Notes (Signed)
Subjective:     Andrea [name] is a 64 y.o. female presenting for Follow-up (6 month )     HPI  #Post stroke - is doing better - is staying in some of her bed more often due to being cold - does feel her speech is improving - learning words but difficulty expressing - using a cane at home - wheel chair when she is doing long trips out of the house    #HTN - have been checking bp - 136/76 in May - not checking regularly    Review of Systems  01/30/2020: Clinic - HTN - home monitoring and call if elevated. Diabetes - well controlled  Social History   Tobacco Use  Smoking Status Former Smoker  . Quit date: 12/2016  . Years since quitting: 3.7  Smokeless Tobacco Never Used        Objective:    BP Readings from Last 3 Encounters:  08/31/20 (!) 150/82  01/30/20 (!) 164/82  10/17/19 (!) 148/76   Wt Readings from Last 3 Encounters:  08/31/20 137 lb 8 oz (62.4 kg)  01/30/20 126 lb 8 oz (57.4 kg)  10/17/19 119 lb 4 oz (54.1 kg)    BP (!) 150/82   Pulse 76   Temp 97.8 F (36.6 C) (Temporal)   Ht 5\' 5"  (1.651 m)   Wt 137 lb 8 oz (62.4 kg)   SpO2 97%   BMI 22.88 kg/m    Physical Exam Constitutional:      General: She is not in acute distress.    Appearance: She is well-developed. She is not diaphoretic.     Comments: In wheel chair but ambulates with cane  HENT:     Right Ear: External ear normal.     Left Ear: External ear normal.     Nose: Nose normal.  Eyes:     Conjunctiva/sclera: Conjunctivae normal.  Cardiovascular:     Rate and Rhythm: Normal rate and regular rhythm.     Heart sounds: No murmur heard.   Pulmonary:     Effort: Pulmonary effort is normal. No respiratory distress.     Breath sounds: Normal breath sounds. No wheezing.  Musculoskeletal:     Cervical back: Neck supple.  Skin:    General: Skin is warm and dry.     Capillary Refill: Capillary refill takes less than 2 seconds.  Neurological:     Mental Status: She is alert.  Mental status is at baseline.  Psychiatric:        Mood and Affect: Mood normal.        Behavior: Behavior normal.     Comments: 1-2 word responses to questions           Assessment & Plan:   Problem List Items Addressed This Visit      Cardiovascular and Mediastinum   Essential hypertension    BP elevated again. Not regularly checking at home, but reports normal number. Cont metoprolol 12.5 BID, hydralazine 20 mg q8h, and amlodipine 10 mg. They will call in 1 week with home monitoring. May consider ACE-I or HCTZ if still elevated.         Endocrine   Type 2 diabetes mellitus with other specified complication (HCC) - Primary    Worsening hgb a1c but still diet controlled. Complicated by HTN, HLD, prior stroke. Continue diabetic diet. Home CBG monitoring - call if elevated and may restart metformin.         Nervous and Auditory  Right sided weakness   Relevant Orders   Ambulatory referral to Neurology     Other   History of stroke    Complicated by right sided weakness, aphasia, and slow movement. She is on amantadine post stroke and overall are noticing some improvement. She has not seen Neurology since her stroke. Will refer to local neurologist to have continued support. Encouraged her to keep up with home PT. Cont amantadine 100 mg BID, pravastatin 80 mg and ASA 81 mg. HTN management.       Relevant Orders   Ambulatory referral to Neurology   Aphasia as late effect of stroke   Relevant Orders   Ambulatory referral to Neurology       Return in about 5 months (around 01/29/2021).  [name], MD  This visit occurred during the SARS-CoV-2 public health emergency.  Safety protocols were in place, including screening questions prior to the visit, additional usage of staff PPE, and extensive cleaning of exam room while observing appropriate contact time as indicated for disinfecting solutions.

## 2020-09-10 ENCOUNTER — Encounter: Payer: Self-pay | Admitting: Family Medicine

## 2020-10-11 ENCOUNTER — Encounter: Payer: Self-pay | Admitting: Family Medicine

## 2020-10-14 ENCOUNTER — Other Ambulatory Visit: Payer: Self-pay

## 2020-10-14 ENCOUNTER — Other Ambulatory Visit: Payer: Medicare Other

## 2020-10-14 ENCOUNTER — Other Ambulatory Visit: Payer: Self-pay | Admitting: Family Medicine

## 2020-10-14 ENCOUNTER — Encounter: Payer: Self-pay | Admitting: Family Medicine

## 2020-10-14 ENCOUNTER — Other Ambulatory Visit (INDEPENDENT_AMBULATORY_CARE_PROVIDER_SITE_OTHER): Payer: Medicare Other | Admitting: Family Medicine

## 2020-10-14 ENCOUNTER — Telehealth (INDEPENDENT_AMBULATORY_CARE_PROVIDER_SITE_OTHER): Payer: Medicare Other | Admitting: Family Medicine

## 2020-10-14 DIAGNOSIS — J069 Acute upper respiratory infection, unspecified: Secondary | ICD-10-CM

## 2020-10-14 LAB — POCT RAPID STREP A (OFFICE): Rapid Strep A Screen: POSITIVE — AB

## 2020-10-14 LAB — POC INFLUENZA A&B (BINAX/QUICKVUE)
Influenza A, POC: NEGATIVE
Influenza B, POC: NEGATIVE

## 2020-10-14 MED ORDER — AMOXICILLIN 500 MG PO CAPS
500.0000 mg | ORAL_CAPSULE | Freq: Two times a day (BID) | ORAL | 0 refills | Status: DC
Start: 2020-10-14 — End: 2020-10-30

## 2020-10-14 NOTE — Telephone Encounter (Signed)
Alerted that strep test was positive I pended px for amoxicillin  (tabs) to send to pharmacy of choice If they prefer liquid then let me know   Continue to enc fluids and keep [place] posted

## 2020-10-14 NOTE — Patient Instructions (Signed)
Encourage lots of fluids  If unable to swallow (or drooling) please go to the ER  Tylenol as needed for pain or fever  The office will call you to set up swabs for flu and covid and strep  We will contact you with results   Watch closely for shortness of breath or pulse oximetry at rest under 91-92%   Also wheezing If this occurs please go to the ER   Please keep [place] updated

## 2020-10-14 NOTE — Progress Notes (Addendum)
Virtual Visit via Video Note  I connected with Andrea [name] on 10/14/20 at  3:30 PM EST by a video enabled telemedicine application and verified that I am speaking with the correct person using two identifiers.  Location: Patient: home Provider: office   I discussed the limitations of evaluation and management by telemedicine and the availability of in person appointments. The patient expressed understanding and agreed to proceed.  Parties involved in encounter  Patient: Andrea [name]  Guardian :[name] (daughter)  Provider:  Roxy Manns MD    History of Present Illness: 65 yo pt of Dr Selena Batten presents for chills and body aches and decrease in appetite  Caregiver had covid - tested pos on 12/28  Double masked and had to continue caring for her  Pt developed symptoms about a week later   Chills/sweats  Legs hurt (has been lying in bed)- needs asst  No fever  Does not want to swallow (? Sore throat)  Low appetite  Throat is sore also  No headache   Some runny nose  (mucous had some color 2 it several days ago)  Voice is hoarse (does not want to talk)  No cough  H/o asthma -no wheezing heard (seems to be breathing more with exertion-unsure if she is uncomfortable) Just got a pulse oximeter and will start checking    otc medicines  Drinking fluids  chlorcedin flu   imm status- no flu shot or covid shot or pna shot- will not let them do it    H/o pneumonia in 2018 , 2017    Patient Active Problem List   Diagnosis Date Noted  . Viral URI 10/14/2020  . Urinary incontinence due to immobility 10/17/2019  . Asthma 01/07/2019  . HLD (hyperlipidemia) 01/07/2019  . History of stroke 10/01/2018  . Aphasia as late effect of stroke 10/01/2018  . Right sided weakness 10/01/2018  . Essential hypertension 10/01/2018  . Type 2 diabetes mellitus with other specified complication (HCC) 10/01/2018  . Advanced care planning/counseling discussion 10/01/2018   Past Medical  History:  Diagnosis Date  . Stroke [facility])    Past Surgical History:  Procedure Laterality Date  . BREAST BIOPSY Left   . CESAREAN SECTION     4   . CRANIOPLASTY Left 08/08/2017  . CRANIOTOMY FOR HEMISPHERECTOMY TOTAL / PARTIAL Left 02/16/2017  . PATELLA FRACTURE SURGERY Right   . PERCUTANEOUS ENDOSCOPIC GASTROSTOMY (PEG) REMOVAL    . TUBAL LIGATION     Social History   Tobacco Use  . Smoking status: Former Smoker    Quit date: 12/2016    Years since quitting: 3.8  . Smokeless tobacco: Never Used   Family History  Problem Relation Age of Onset  . Stroke Mother   . Diabetes Mother   . Hypertension Mother   . Aneurysm Father   . Diabetes Sister   . Hypertension Sister   . Diabetes Sister   . Hypertension Sister   . Hypertension Sister   . Hypertension Sister    Allergies  Allergen Reactions  . Citric Acid Hives   Current Outpatient Medications on File Prior to Visit  Medication Sig Dispense Refill  . amantadine (SYMMETREL) 100 MG capsule Take 1 capsule (100 mg total) by mouth 2 (two) times daily. 180 capsule 3  . amLODipine (NORVASC) 10 MG tablet Take 1 tablet (10 mg total) by mouth daily. 90 tablet 3  . aspirin EC 81 MG tablet Take 81 mg by mouth daily.    Marland Kitchen  Cholecalciferol 50 MCG (2000 UT) CAPS Take by mouth.    . hydrALAZINE (APRESOLINE) 10 MG tablet Take 2 tablets (20 mg total) by mouth every 8 (eight) hours as needed. 360 tablet 3  . metoprolol tartrate (LOPRESSOR) 25 MG tablet Take 0.5 tablets (12.5 mg total) by mouth 2 (two) times daily. 90 tablet 3  . Multiple Vitamins-Minerals (CENTRUM SILVER PO) Take 1 tablet by mouth daily.    Marland Kitchen OVER THE COUNTER MEDICATION OMEGA 3 FISH OIL. 2000 MG. 1 DAILY    . pravastatin (PRAVACHOL) 80 MG tablet Take 1 tablet (80 mg total) by mouth daily. 90 tablet 3  . Probiotic Product (PROBIOTIC-10 PO) Take by mouth. 1 tablet daily    . senna (SENOKOT) 8.6 MG tablet Take 1 tablet by mouth daily.    . vitamin C (ASCORBIC ACID) 500 MG  tablet Take 500 mg by mouth daily.     No current facility-administered medications on file prior to visit.   Review of Systems  Constitutional: Positive for diaphoresis and malaise/fatigue. Negative for chills and fever.  HENT: Positive for congestion and sore throat. Negative for ear pain and sinus pain.   Eyes: Negative for blurred vision, discharge and redness.  Respiratory: Negative for cough, shortness of breath and stridor.   Cardiovascular: Negative for chest pain, palpitations and leg swelling.       Breathes harder with exertion- may be due to discomfort  Not wheezing or sob  Gastrointestinal: Negative for abdominal pain, diarrhea, nausea and vomiting.  Musculoskeletal: Negative for myalgias.  Skin: Negative for rash.  Neurological: Negative for dizziness and headaches.    Observations/Objective: Patient appears well, in no distress (sitting in bed) Weight is baseline  No facial swelling or asymmetry Voice sounds hoarse when she talks  (caregiver gives most of the history) No obvious tremor or mobility impairment Moving neck and UEs normally Answers a few questions  No cough or shortness of breath during interview  Cognition is baseline (not conversative)  No skin changes on face or neck , no rash or pallor Affect is normal    Assessment and Plan: Problem List Items Addressed This Visit      Respiratory   Viral URI    Most likely has chest congestion  Some sweats (no fever on thermometer) and aches Exp to covid over a week ago from caregiver Not immunized for flu or covid or pna (per caregiver refuses)  Drinking fluids but not eating  Indicates that swallowing is uncomfortable (sore throat)  Ordered swab for flu and covid and strep (pending)  Enc tylenol prn along with fluids/rest  Rev parameters for ER (sob/ wheezing/high fever or inability to swallow fluids) or any severe symptoms            Follow Up Instructions: Encourage lots of fluids  If unable to  swallow (or drooling) please go to the ER  Tylenol as needed for pain or fever  The office will call you to set up swabs for flu and covid and strep  We will contact you with results   Watch closely for shortness of breath or pulse oximetry at rest under 91-92%   Also wheezing If this occurs please go to the ER   Please keep [place] updated    I discussed the assessment and treatment plan with the patient. The patient was provided an opportunity to ask questions and all were answered. The patient agreed with the plan and demonstrated an understanding of the instructions.  The patient was advised to call back or seek an in-person evaluation if the symptoms worsen or if the condition fails to improve as anticipated.  Addend: positive strep test today     Roxy Manns, MD

## 2020-10-14 NOTE — Telephone Encounter (Signed)
Family notified of positive strep and neg flu. Rx sent to pharmacy

## 2020-10-14 NOTE — Assessment & Plan Note (Signed)
Most likely has chest congestion  Some sweats (no fever on thermometer) and aches Exp to covid over a week ago from caregiver Not immunized for flu or covid or pna (per caregiver refuses)  Drinking fluids but not eating  Indicates that swallowing is uncomfortable (sore throat)  Ordered swab for flu and covid and strep (pending)  Enc tylenol prn along with fluids/rest  Rev parameters for ER (sob/ wheezing/high fever or inability to swallow fluids) or any severe symptoms

## 2020-10-18 LAB — NOVEL CORONAVIRUS, NAA: SARS-CoV-2, NAA: NOT DETECTED

## 2020-10-20 ENCOUNTER — Telehealth: Payer: Self-pay | Admitting: *Deleted

## 2020-10-20 NOTE — Telephone Encounter (Signed)
Left VM requesting pt's daughter to call the office back 

## 2020-10-20 NOTE — Telephone Encounter (Signed)
-----   Message from Judy Pimple, MD sent at 10/18/2020 10:12 AM EST ----- Covid test is negative  Symptoms were likely from strep  Let us know if not improving

## 2020-10-30 ENCOUNTER — Other Ambulatory Visit: Payer: Self-pay

## 2020-10-30 ENCOUNTER — Emergency Department: Payer: Medicare Other

## 2020-10-30 ENCOUNTER — Inpatient Hospital Stay
Admission: EM | Admit: 2020-10-30 | Discharge: 2020-11-04 | DRG: 871 | Disposition: A | Discharge status: Home / Self Care | Payer: Medicare Other | Attending: Hospitalist | Admitting: Hospitalist

## 2020-10-30 ENCOUNTER — Encounter: Payer: Self-pay | Admitting: Emergency Medicine

## 2020-10-30 DIAGNOSIS — Z79899 Other long term (current) drug therapy: Secondary | ICD-10-CM

## 2020-10-30 DIAGNOSIS — N179 Acute kidney failure, unspecified: Secondary | ICD-10-CM

## 2020-10-30 DIAGNOSIS — F0390 Unspecified dementia without behavioral disturbance: Secondary | ICD-10-CM | POA: Diagnosis present

## 2020-10-30 DIAGNOSIS — J189 Pneumonia, unspecified organism: Secondary | ICD-10-CM

## 2020-10-30 DIAGNOSIS — E1159 Type 2 diabetes mellitus with other circulatory complications: Secondary | ICD-10-CM | POA: Diagnosis present

## 2020-10-30 DIAGNOSIS — I69351 Hemiplegia and hemiparesis following cerebral infarction affecting right dominant side: Secondary | ICD-10-CM

## 2020-10-30 DIAGNOSIS — E785 Hyperlipidemia, unspecified: Secondary | ICD-10-CM | POA: Diagnosis present

## 2020-10-30 DIAGNOSIS — A419 Sepsis, unspecified organism: Secondary | ICD-10-CM

## 2020-10-30 DIAGNOSIS — N3000 Acute cystitis without hematuria: Secondary | ICD-10-CM

## 2020-10-30 DIAGNOSIS — Z8673 Personal history of transient ischemic attack (TIA), and cerebral infarction without residual deficits: Secondary | ICD-10-CM

## 2020-10-30 DIAGNOSIS — K5909 Other constipation: Secondary | ICD-10-CM | POA: Diagnosis present

## 2020-10-30 DIAGNOSIS — Z20822 Contact with and (suspected) exposure to covid-19: Secondary | ICD-10-CM | POA: Diagnosis present

## 2020-10-30 DIAGNOSIS — I10 Essential (primary) hypertension: Secondary | ICD-10-CM

## 2020-10-30 DIAGNOSIS — G928 Other toxic encephalopathy: Secondary | ICD-10-CM | POA: Diagnosis present

## 2020-10-30 DIAGNOSIS — R000 Tachycardia, unspecified: Secondary | ICD-10-CM | POA: Diagnosis present

## 2020-10-30 DIAGNOSIS — E1165 Type 2 diabetes mellitus with hyperglycemia: Secondary | ICD-10-CM | POA: Diagnosis present

## 2020-10-30 DIAGNOSIS — G8190 Hemiplegia, unspecified affecting unspecified side: Secondary | ICD-10-CM

## 2020-10-30 DIAGNOSIS — I152 Hypertension secondary to endocrine disorders: Secondary | ICD-10-CM | POA: Diagnosis present

## 2020-10-30 DIAGNOSIS — E876 Hypokalemia: Secondary | ICD-10-CM | POA: Diagnosis present

## 2020-10-30 DIAGNOSIS — E872 Acidosis, unspecified: Secondary | ICD-10-CM

## 2020-10-30 DIAGNOSIS — R739 Hyperglycemia, unspecified: Secondary | ICD-10-CM

## 2020-10-30 DIAGNOSIS — R1310 Dysphagia, unspecified: Secondary | ICD-10-CM | POA: Diagnosis present

## 2020-10-30 DIAGNOSIS — E860 Dehydration: Secondary | ICD-10-CM | POA: Diagnosis present

## 2020-10-30 DIAGNOSIS — R5381 Other malaise: Secondary | ICD-10-CM | POA: Diagnosis not present

## 2020-10-30 DIAGNOSIS — R6510 Systemic inflammatory response syndrome (SIRS) of non-infectious origin without acute organ dysfunction: Secondary | ICD-10-CM | POA: Diagnosis present

## 2020-10-30 DIAGNOSIS — R7989 Other specified abnormal findings of blood chemistry: Secondary | ICD-10-CM | POA: Diagnosis present

## 2020-10-30 DIAGNOSIS — I69320 Aphasia following cerebral infarction: Secondary | ICD-10-CM

## 2020-10-30 DIAGNOSIS — Z9109 Other allergy status, other than to drugs and biological substances: Secondary | ICD-10-CM

## 2020-10-30 DIAGNOSIS — R627 Adult failure to thrive: Secondary | ICD-10-CM | POA: Diagnosis present

## 2020-10-30 DIAGNOSIS — R531 Weakness: Secondary | ICD-10-CM | POA: Diagnosis not present

## 2020-10-30 DIAGNOSIS — Z823 Family history of stroke: Secondary | ICD-10-CM

## 2020-10-30 DIAGNOSIS — E11649 Type 2 diabetes mellitus with hypoglycemia without coma: Secondary | ICD-10-CM | POA: Diagnosis not present

## 2020-10-30 DIAGNOSIS — Z8249 Family history of ischemic heart disease and other diseases of the circulatory system: Secondary | ICD-10-CM

## 2020-10-30 DIAGNOSIS — R6520 Severe sepsis without septic shock: Secondary | ICD-10-CM

## 2020-10-30 DIAGNOSIS — E870 Hyperosmolality and hypernatremia: Secondary | ICD-10-CM

## 2020-10-30 DIAGNOSIS — Z833 Family history of diabetes mellitus: Secondary | ICD-10-CM

## 2020-10-30 DIAGNOSIS — Z87891 Personal history of nicotine dependence: Secondary | ICD-10-CM

## 2020-10-30 DIAGNOSIS — Z7982 Long term (current) use of aspirin: Secondary | ICD-10-CM

## 2020-10-30 DIAGNOSIS — E2740 Unspecified adrenocortical insufficiency: Secondary | ICD-10-CM | POA: Diagnosis present

## 2020-10-30 DIAGNOSIS — R579 Shock, unspecified: Secondary | ICD-10-CM

## 2020-10-30 DIAGNOSIS — E1169 Type 2 diabetes mellitus with other specified complication: Secondary | ICD-10-CM | POA: Diagnosis present

## 2020-10-30 LAB — BLOOD GAS, VENOUS
Acid-base deficit: 7.7 mmol/L — ABNORMAL HIGH (ref 0.0–2.0)
Bicarbonate: 16.2 mmol/L — ABNORMAL LOW (ref 20.0–28.0)
O2 Saturation: 86.6 %
Patient temperature: 37
pCO2, Ven: 28 mmHg — ABNORMAL LOW (ref 44.0–60.0)
pH, Ven: 7.37 (ref 7.250–7.430)
pO2, Ven: 54 mmHg — ABNORMAL HIGH (ref 32.0–45.0)

## 2020-10-30 LAB — URINALYSIS, COMPLETE (UACMP) WITH MICROSCOPIC
Bilirubin Urine: NEGATIVE
Glucose, UA: >=500 mg/dL — AB
Hgb urine dipstick: NEGATIVE
Ketones, ur: 5 mg/dL — AB
Leukocytes,Ua: NEGATIVE
Nitrite: NEGATIVE
Protein, ur: >=300 mg/dL — AB
Specific Gravity, Urine: 1.023 (ref 1.005–1.030)
pH: 5 (ref 5.0–8.0)

## 2020-10-30 LAB — BASIC METABOLIC PANEL
Anion gap: 19 — ABNORMAL HIGH (ref 5–15)
BUN: 37 mg/dL — ABNORMAL HIGH (ref 8–23)
CO2: 17 mmol/L — ABNORMAL LOW (ref 22–32)
Calcium: 10.7 mg/dL — ABNORMAL HIGH (ref 8.9–10.3)
Chloride: 117 mmol/L — ABNORMAL HIGH (ref 98–111)
Creatinine, Ser: 3.36 mg/dL — ABNORMAL HIGH (ref 0.44–1.00)
GFR, Estimated: 15 mL/min — ABNORMAL LOW (ref >60)
Glucose, Bld: 165 mg/dL — ABNORMAL HIGH (ref 70–99)
Potassium: 4.1 mmol/L (ref 3.5–5.1)
Sodium: 153 mmol/L — ABNORMAL HIGH (ref 135–145)

## 2020-10-30 LAB — SARS CORONAVIRUS 2 BY RT PCR (HOSPITAL ORDER, PERFORMED IN ~~LOC~~ HOSPITAL LAB): SARS Coronavirus 2: NEGATIVE

## 2020-10-30 LAB — CBC
HCT: 36.9 % (ref 36.0–46.0)
Hemoglobin: 11.9 g/dL — ABNORMAL LOW (ref 12.0–15.0)
MCH: 26.9 pg (ref 26.0–34.0)
MCHC: 32.2 g/dL (ref 30.0–36.0)
MCV: 83.5 fL (ref 80.0–100.0)
Platelets: 473 10*3/uL — ABNORMAL HIGH (ref 150–400)
RBC: 4.42 MIL/uL (ref 3.87–5.11)
RDW: 15.3 % (ref 11.5–15.5)
WBC: 12.1 10*3/uL — ABNORMAL HIGH (ref 4.0–10.5)
nRBC: 0 % (ref 0.0–0.2)

## 2020-10-30 LAB — LACTIC ACID, PLASMA
Lactic Acid, Venous: 5 mmol/L (ref 0.5–1.9)
Lactic Acid, Venous: 1.6 mmol/L (ref 0.5–1.9)

## 2020-10-30 LAB — PROTIME-INR
INR: 1.1 (ref 0.8–1.2)
Prothrombin Time: 14.1 seconds (ref 11.4–15.2)

## 2020-10-30 LAB — CBG MONITORING, ED
Glucose-Capillary: 198 mg/dL — ABNORMAL HIGH (ref 70–99)
Glucose-Capillary: 118 mg/dL — ABNORMAL HIGH (ref 70–99)

## 2020-10-30 LAB — HEPATIC FUNCTION PANEL
ALT: 21 U/L (ref 0–44)
AST: 36 U/L (ref 15–41)
Albumin: 3.3 g/dL — ABNORMAL LOW (ref 3.5–5.0)
Alkaline Phosphatase: 77 U/L (ref 38–126)
Bilirubin, Direct: <0.1 mg/dL (ref 0.0–0.2)
Total Bilirubin: 1.3 mg/dL — ABNORMAL HIGH (ref 0.3–1.2)
Total Protein: 9 g/dL — ABNORMAL HIGH (ref 6.5–8.1)

## 2020-10-30 LAB — APTT: aPTT: 32 seconds (ref 24–36)

## 2020-10-30 LAB — BETA-HYDROXYBUTYRIC ACID: Beta-Hydroxybutyric Acid: 1.99 mmol/L — ABNORMAL HIGH (ref 0.05–0.27)

## 2020-10-30 LAB — GROUP A STREP BY PCR: Group A Strep by PCR: NOT DETECTED

## 2020-10-30 LAB — MAGNESIUM: Magnesium: 2.6 mg/dL — ABNORMAL HIGH (ref 1.7–2.4)

## 2020-10-30 MED ORDER — LACTATED RINGERS IV BOLUS (SEPSIS)
30.0000 mL/kg | Freq: Once | INTRAVENOUS | Status: AC
  Administered 2020-10-30: 1824 mL via INTRAVENOUS

## 2020-10-30 MED ORDER — LABETALOL HCL 5 MG/ML IV SOLN: 10.0000 mg | INTRAVENOUS | Status: DC

## 2020-10-30 MED ORDER — PRAVASTATIN SODIUM 40 MG PO TABS
80.0000 mg | ORAL_TABLET | Freq: Every day | ORAL | Status: DC
  Filled 2020-10-30: qty 2

## 2020-10-30 MED ORDER — VANCOMYCIN HCL IN DEXTROSE 1-5 GM/200ML-% IV SOLN: 1000.0000 mg | Freq: Once | INTRAVENOUS | Status: DC

## 2020-10-30 MED ORDER — HYDRALAZINE HCL 10 MG PO TABS
20.0000 mg | ORAL_TABLET | Freq: Three times a day (TID) | ORAL | Status: DC
  Administered 2020-10-31: 20 mg via ORAL
  Filled 2020-10-30 (×4): qty 2

## 2020-10-30 MED ORDER — ASPIRIN EC 81 MG PO TBEC: 81.0000 mg | DELAYED_RELEASE_TABLET | Freq: Every day | ORAL | Status: DC

## 2020-10-30 MED ORDER — LACTATED RINGERS IV SOLN: INTRAVENOUS | Status: DC

## 2020-10-30 MED ORDER — LABETALOL HCL 5 MG/ML IV SOLN: 10.0000 mg | INTRAVENOUS | Status: DC | PRN

## 2020-10-30 MED ORDER — LABETALOL HCL 5 MG/ML IV SOLN
INTRAVENOUS | Status: AC
  Filled 2020-10-30: qty 4

## 2020-10-30 MED ORDER — SODIUM CHLORIDE 0.9 % IV SOLN
1.0000 g | Freq: Once | INTRAVENOUS | Status: AC
  Administered 2020-10-30: 1 g via INTRAVENOUS
  Filled 2020-10-30: qty 10

## 2020-10-30 MED ORDER — NITROGLYCERIN IN D5W 200-5 MCG/ML-% IV SOLN
0.0000 ug/min | INTRAVENOUS | Status: DC
Start: 2020-10-30 — End: 2020-10-30

## 2020-10-30 MED ORDER — ACETAMINOPHEN 650 MG RE SUPP
650.0000 mg | Freq: Four times a day (QID) | RECTAL | Status: DC | PRN
  Administered 2020-10-30 – 2020-10-31 (×2): 650 mg via RECTAL
  Filled 2020-10-30 (×2): qty 1

## 2020-10-30 MED ORDER — ONDANSETRON HCL 4 MG PO TABS: 4.0000 mg | ORAL_TABLET | Freq: Four times a day (QID) | ORAL | Status: DC | PRN

## 2020-10-30 MED ORDER — SODIUM CHLORIDE 0.9 % IV SOLN
2.0000 g | INTRAVENOUS | Status: DC
  Administered 2020-10-30 – 2020-11-01 (×3): 2 g via INTRAVENOUS
  Filled 2020-10-30 (×4): qty 2

## 2020-10-30 MED ORDER — HEPARIN SODIUM (PORCINE) 5000 UNIT/ML IJ SOLN
5000.0000 [IU] | Freq: Three times a day (TID) | INTRAMUSCULAR | Status: DC
  Administered 2020-10-30 – 2020-11-04 (×15): 5000 [IU] via SUBCUTANEOUS
  Filled 2020-10-30 (×15): qty 1

## 2020-10-30 MED ORDER — SODIUM CHLORIDE 0.9 % IV SOLN
2.0000 g | Freq: Once | INTRAVENOUS | Status: DC
  Filled 2020-10-30: qty 2

## 2020-10-30 MED ORDER — SODIUM CHLORIDE 0.9 % IV SOLN: 2.0000 g | Freq: Once | INTRAVENOUS | Status: DC

## 2020-10-30 MED ORDER — ONDANSETRON HCL 4 MG/2ML IJ SOLN: 4.0000 mg | Freq: Four times a day (QID) | INTRAMUSCULAR | Status: DC | PRN

## 2020-10-30 MED ORDER — POLYETHYLENE GLYCOL 3350 17 G PO PACK
17.0000 g | PACK | Freq: Two times a day (BID) | ORAL | Status: DC
  Administered 2020-10-31 – 2020-11-04 (×5): 17 g via ORAL
  Filled 2020-10-30 (×5): qty 1

## 2020-10-30 MED ORDER — METOPROLOL TARTRATE 25 MG PO TABS
12.5000 mg | ORAL_TABLET | Freq: Two times a day (BID) | ORAL | Status: DC
  Administered 2020-10-31: 12.5 mg via ORAL
  Filled 2020-10-30: qty 1
  Filled 2020-10-30: qty 0.5

## 2020-10-30 MED ORDER — AMLODIPINE BESYLATE 5 MG PO TABS: 10.0000 mg | ORAL_TABLET | Freq: Every day | ORAL | Status: DC

## 2020-10-30 MED ORDER — VANCOMYCIN HCL 1500 MG/300ML IV SOLN
1500.0000 mg | Freq: Once | INTRAVENOUS | Status: AC
  Administered 2020-10-30: 1500 mg via INTRAVENOUS
  Filled 2020-10-30: qty 300

## 2020-10-30 MED ORDER — INSULIN ASPART 100 UNIT/ML ~~LOC~~ SOLN
0.0000 [IU] | SUBCUTANEOUS | Status: DC
  Administered 2020-10-31: 2 [IU] via SUBCUTANEOUS
  Filled 2020-10-30: qty 1

## 2020-10-30 MED ORDER — ACETAMINOPHEN 325 MG PO TABS: 325.0000 mg | ORAL_TABLET | Freq: Four times a day (QID) | ORAL | Status: DC | PRN

## 2020-10-30 MED ORDER — METRONIDAZOLE IN NACL 5-0.79 MG/ML-% IV SOLN
500.0000 mg | Freq: Three times a day (TID) | INTRAVENOUS | Status: DC
  Administered 2020-10-30 – 2020-10-31 (×2): 500 mg via INTRAVENOUS
  Filled 2020-10-30 (×2): qty 100

## 2020-10-30 MED ORDER — VANCOMYCIN VARIABLE DOSE PER UNSTABLE RENAL FUNCTION (PHARMACIST DOSING): Status: DC

## 2020-10-30 MED ORDER — AMANTADINE HCL 100 MG PO CAPS
100.0000 mg | ORAL_CAPSULE | Freq: Two times a day (BID) | ORAL | Status: DC
  Administered 2020-11-02 – 2020-11-04 (×5): 100 mg via ORAL
  Filled 2020-10-30 (×12): qty 1

## 2020-10-30 MED ORDER — VANCOMYCIN HCL 750 MG/150ML IV SOLN: 750.0000 mg | INTRAVENOUS | Status: DC

## 2020-10-30 MED ORDER — SENNA 8.6 MG PO TABS: 1.0000 | ORAL_TABLET | Freq: Every day | ORAL | Status: DC

## 2020-10-30 NOTE — Progress Notes (Signed)
CODE SEPSIS - PHARMACY COMMUNICATION  **Broad Spectrum Antibiotics should be administered within 1 hour of Sepsis diagnosis**  Time Code Sepsis Called/Page Received: 1556  Antibiotics Ordered: ceftriaxone  Time of 1st antibiotic administration: 1715    Pricilla Riffle ,PharmD Clinical Pharmacist  10/30/2020  5:42 PM

## 2020-10-30 NOTE — Progress Notes (Addendum)
Pharmacy Antibiotic Note  Andrea [name] is a 65 y.o. female admitted on 10/30/2020 with sepsis of unknown source. 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 Pharmacy has been consulted for vancomycin and cefepime dosing. Scr 3.36 on admission; based on past labs, baseline looks to be closer to 1. WBC 12.1, lactic acid 5>1.6, afebrile.     Plan: Cefepime 2g IV q24h Vancomycin 1500mg  IV x1, then use variable dosing for future doses due to AKI - Scr 3.36 -Monitor renal function and adjust doses as clinically indicated -Follow up with cultures   Height: 5\' 5"  (165.1 cm) Weight: 60.8 kg (134 lb) IBW/kg (Calculated) : 57  Temp (24hrs), Avg:98 F (36.7 C), Min:98 F (36.7 C), Max:98 F (36.7 C)  Recent Labs  Lab 10/30/20 1227 10/30/20 1438 10/30/20 1713  WBC 12.1*  --   --   CREATININE 3.36*  --   --   LATICACIDVEN  --  5.0* 1.6    Estimated Creatinine Clearance: 15.4 mL/min (A) (by C-G formula based on SCr of 3.36 mg/dL (H)).    Allergies  Allergen Reactions  . Citric Acid Hives    Antimicrobials this admission: 1/28 ceftriaxone x1 1/28 cefepime >>  1/28 metronidazole >> 1/28 vancomycin >>   Microbiology results: 1/28 BCx: pending 1/28 UCx: pending  Thank you for allowing pharmacy to be a part of this patient's care.  2/28, PharmD Pharmacy Resident  10/30/2020 6:18 PM

## 2020-10-30 NOTE — ED Notes (Signed)
Provider notified of critical Lactic acid

## 2020-10-30 NOTE — H&P (Addendum)
History and Physical   Andrea [name] UEK:[id number] DOB: 1956/07/11 DOA: 10/30/2020  PCP: [name], MD  Outpatient Specialists: Dr. Cristopher Peru Patient coming from: home  I have personally briefly reviewed patient's old medical records in [facility] EMR.  Chief Concern: Poor p.o. intake for 3 weeks  HPI: Andrea [name] is a 66 y.o. female with medical history significant for hypertension, history of left MCA infarct status post left craniotomy with cranioplasty flap, persistent aphasia, hyperlipidemia, presents to the emergency department for chief concerns of poor p.o. intake and weakness for about 3 weeks.  HPI was obtained from daughter at bedside.  Patient mumbles and speech is garbled along with aphasia from prior stroke.  Per daughter, patient was diagnosed with on 10/14/2020 for strep throat.  Daughter reports that since then patient has had poor PO intake since.  Daughter denies no nausea or vomiting or fever.  Patient just refuses to eat.  ROS: Unable to obtain due to patient with history of expressive aphasia from stroke and dementia  ED Course: Discussed with ED provider, patient requiring hospitalization due to possibility of sepsis.  Assessment/Plan  Principal Problem:   Sepsis (HCC) Active Problems:   History of stroke   Aphasia as late effect of stroke   Right sided weakness   Essential hypertension   HLD (hyperlipidemia)   Dehydration   AKI (acute kidney injury) (HCC)   Debility   Sepsis Query severe sepsis with elevated lactic acid, leukocytosis, sinus tachycardia, and organ involvement is renal -Portable chest x-ray was unremarkable -Blood cultures, urine cultures have been collected and pending -Checking pro-Cal, Legionella -Broad-spectrum antibiotics -Aggressive fluid rehydration -Group strep a by PCR is not detected -We will follow culture results  Acute kidney injury-suspect secondary to dehydration in setting of poor p.o. intake Metabolic acidosis  secondary to acute kidney injury Dehydration -Query secondary to sepsis -Status post sepsis fluid bolus per ED provider -LR IVF at 125 cc/h for 1 day -BMP in the a.m.  Altered mentation for 3 weeks per daughter -Checking ammonia, TSH  Hypertension-Per daughter patient has had her a.m. antihypertensives, however has not had her p.m. antihypertensives including hydralazine and metoprolol tartrate -BNP was elevated at bedside with SBP greater than 200 however on recheck manually per nursing, her blood pressure was 148/87 and 154/89 -Initially nitroglycerin GTT was ordered however as repeat showed SBP in the 140s and 150s, nitroglycerin GTT was discontinued -Labetalol 10 mg IV as needed every 10 minutes for SBP greater than 180 -Resumed home amlodipine 10 mg daily, hydralazine 20 mg p.o. every 8 hours, metoprolol tartrate 12.5 mg p.o. daily  Hyperlipidemia-resumed home pravastatin 80 mg daily  Constipation-senna scheduled and GlycoLax 17 g p.o. twice daily  Dementia-amantadine 100 mg p.o. twice daily resumed  Debility with poor p.o. intake for 3 weeks-query progression of dementia and possible failure to thrive in setting of chronic left MCA stroke status post craniotomy -I talked to the daughter at bedside and states that if she continues to refuse food, the next step would be PEG tube placement.  Daughter is aware. -N.p.o. -SLP consulted  Chart reviewed.   DVT prophylaxis: Heparin 5000 units every 8 hours Code Status: Full code Diet: N.p.o. pending speech evaluation Family Communication: Discussed with daughter at bedside, Andrea [name] Disposition Plan: Pending clinical course Consults called: None at this time Admission status: Observation to progressive cardiac  Past Medical History:  Diagnosis Date  . Stroke [facility])    Past Surgical History:  Procedure Laterality  Date  . BREAST BIOPSY Left   . CESAREAN SECTION     4   . CRANIOPLASTY Left 08/08/2017  . CRANIOTOMY  FOR HEMISPHERECTOMY TOTAL / PARTIAL Left 02/16/2017  . PATELLA FRACTURE SURGERY Right   . PERCUTANEOUS ENDOSCOPIC GASTROSTOMY (PEG) REMOVAL    . TUBAL LIGATION     Social History:  reports that she quit smoking about 3 years ago. She has never used smokeless tobacco. No history on file for alcohol use and drug use.  Allergies  Allergen Reactions  . Citric Acid Hives   Family History  Problem Relation Age of Onset  . Stroke Mother   . Diabetes Mother   . Hypertension Mother   . Aneurysm Father   . Diabetes Sister   . Hypertension Sister   . Diabetes Sister   . Hypertension Sister   . Hypertension Sister   . Hypertension Sister    Family history: Family history reviewed and not pertinent  Prior to Admission medications   Medication Sig Start Date End Date Taking? Authorizing Provider  amantadine (SYMMETREL) 100 MG capsule Take 1 capsule (100 mg total) by mouth 2 (two) times daily. 01/30/20   [name], MD  amLODipine (NORVASC) 10 MG tablet Take 1 tablet (10 mg total) by mouth daily. 01/30/20   [name], MD  amoxicillin (AMOXIL) 500 MG capsule Take 1 capsule (500 mg total) by mouth 2 (two) times daily. 10/14/20   Tower, Audrie Gallus, MD  aspirin EC 81 MG tablet Take 81 mg by mouth daily.    [provider]  Cholecalciferol 50 MCG (2000 UT) CAPS Take by mouth.    [provider]  hydrALAZINE (APRESOLINE) 10 MG tablet Take 2 tablets (20 mg total) by mouth every 8 (eight) hours as needed. 01/30/20   [name], MD  metoprolol tartrate (LOPRESSOR) 25 MG tablet Take 0.5 tablets (12.5 mg total) by mouth 2 (two) times daily. 01/30/20   [name], MD  Multiple Vitamins-Minerals (CENTRUM SILVER PO) Take 1 tablet by mouth daily.    [provider]  OVER THE COUNTER MEDICATION OMEGA 3 FISH OIL. 2000 MG. 1 DAILY    [provider]  pravastatin (PRAVACHOL) 80 MG tablet Take 1 tablet (80 mg total) by mouth daily. 01/30/20   [name], MD   Probiotic Product (PROBIOTIC-10 PO) Take by mouth. 1 tablet daily    [provider]  senna (SENOKOT) 8.6 MG tablet Take 1 tablet by mouth daily.    [provider]  vitamin C (ASCORBIC ACID) 500 MG tablet Take 500 mg by mouth daily.    [provider]   Physical Exam: Vitals:   10/30/20 1600 10/30/20 1645 10/30/20 1715 10/30/20 1840  BP:    (!) 154/89  Pulse: 95 (!) 107 (!) 103 (!) 119  Resp:   18 (!) 22  Temp:      TempSrc:      SpO2: 99% 100% 100% 100%  Weight:      Height:       Constitutional: appears older than chronological age, NAD, calm, comfortable Eyes: PERRL, lids and conjunctivae normal ENMT: Mucous membranes are moist. Posterior pharynx clear of any exudate or lesions. Age-appropriate dentition.  And unable to assess hearing as patient mumbles and has garbled speech Neck: normal, supple, no masses, no thyromegaly Respiratory: clear to auscultation bilaterally, no wheezing, no crackles. Normal respiratory effort. No accessory muscle use.  Cardiovascular: Regular rate and rhythm, no murmurs /  rubs / gallops. No extremity edema. 2+ pedal pulses. No carotid bruits.  Abdomen: no tenderness, no masses palpated, no hepatosplenomegaly. Bowel sounds positive.  Musculoskeletal: no clubbing / cyanosis. No joint deformity upper and lower extremities. Good ROM, no contractures, no atrophy. Normal muscle tone.  Mental foot brace on right lower extremity Skin: no rashes, lesions, ulcers. No induration Neurologic: Sensation intact. Strength 5/5 in all 4.  Psychiatric: Normal judgment and insight. Alert and oriented x 3. Normal mood.   EKG: independently reviewed, showing sinus tachycardia with rate of 115, QTc 450  Chest x-ray on Admission: I personally reviewed and I agree with radiologist reading as below.  CT Head Wo Contrast  Result Date: 10/30/2020 CLINICAL DATA:  Mental status change. EXAM: CT HEAD WITHOUT CONTRAST TECHNIQUE: Contiguous axial images  were obtained from the base of the skull through the vertex without intravenous contrast. COMPARISON:  None. FINDINGS: Brain: Encephalomalacia throughout most of the left MCA territory compatible with chronic infarct. This extends into the basal ganglia. Compensatory enlargement left lateral ventricle. Negative for hydrocephalus. Mild white matter changes in the right hemisphere Negative for acute infarct, hemorrhage, mass Vascular: Negative for hyperdense vessel Skull: Left-sided craniotomy. Cranioplasty flap is not well attached to the calvarium and is displaced medially particularly along the inferior and posterior margins of the craniotomy. Bony resorption of the craniotomy flap. Sinuses/Orbits: Air-fluid level in the right sphenoid sinus. Remaining sinuses clear. Other: None IMPRESSION: No prior study. Chronic left MCA infarct. No acute infarct or hemorrhage Left craniotomy. Cranioplasty flap is displaced medially with loss of attachment to the calvarium. Electronically Signed   By: Marlan Palau M.D.   On: 10/30/2020 16:07   DG Chest Port 1 View  Result Date: 10/30/2020 CLINICAL DATA:  Sepsis. EXAM: PORTABLE CHEST 1 VIEW COMPARISON:  None. FINDINGS: The patient is rotated to the right on today's radiograph, reducing diagnostic sensitivity and specificity. Heart size within normal limits. The lungs appear clear. No blunting of the costophrenic angles. A pulmonary cause for sepsis is not identified. IMPRESSION: 1. No active cardiopulmonary disease is radiographically apparent. Electronically Signed   By: Gaylyn Rong M.D.   On: 10/30/2020 15:02   Labs on Admission: I have personally reviewed following labs  CBC: Recent Labs  Lab 10/30/20 1227  WBC 12.1*  HGB 11.9*  HCT 36.9  MCV 83.5  PLT 473*   Basic Metabolic Panel: Recent Labs  Lab 10/30/20 1227  NA 153*  K 4.1  CL 117*  CO2 17*  GLUCOSE 165*  BUN 37*  CREATININE 3.36*  CALCIUM 10.7*  MG 2.6*   GFR: Estimated Creatinine  Clearance: 15.4 mL/min (A) (by C-G formula based on SCr of 3.36 mg/dL (H)).  Liver Function Tests: Recent Labs  Lab 10/30/20 1438  AST 36  ALT 21  ALKPHOS 77  BILITOT 1.3*  PROT 9.0*  ALBUMIN 3.3*   Coagulation Profile: Recent Labs  Lab 10/30/20 1438  INR 1.1   CBG: Recent Labs  Lab 10/30/20 1451  GLUCAP 198*   Urine analysis:    Component Value Date/Time   COLORURINE YELLOW (A) 10/30/2020 1438   APPEARANCEUR CLOUDY (A) 10/30/2020 1438   LABSPEC 1.023 10/30/2020 1438   PHURINE 5.0 10/30/2020 1438   GLUCOSEU >=500 (A) 10/30/2020 1438   HGBUR NEGATIVE 10/30/2020 1438   BILIRUBINUR NEGATIVE 10/30/2020 1438   KETONESUR 5 (A) 10/30/2020 1438   PROTEINUR >=300 (A) 10/30/2020 1438   NITRITE NEGATIVE 10/30/2020 1438   LEUKOCYTESUR NEGATIVE 10/30/2020 1438  CRITICAL CARE Performed by: Lovenia Kim  Total critical care time: 35 minutes  Critical care time was exclusive of separately billable procedures and treating other patients.  Critical care was necessary to treat or prevent imminent or life-threatening deterioration. Sepsis and hypertensive urgency/emergency  Critical care was time spent personally by me on the following activities: development of treatment plan with patient and/or surrogate as well as nursing, discussions with consultants, evaluation of patient's response to treatment, examination of patient, obtaining history from patient or surrogate, ordering and performing treatments and interventions, ordering and review of laboratory studies, ordering and review of radiographic studies, pulse oximetry and re-evaluation of patient's condition.  Sheri Gatchel N Florance Paolillo D.O. [facility]  If 7PM-7AM, please contact overnight-coverage provider If 7AM-7PM, please contact day coverage provider [email or web address]  10/30/2020, 9:34 PM

## 2020-10-30 NOTE — Progress Notes (Signed)
PHARMACY -  BRIEF ANTIBIOTIC NOTE   Pharmacy has received consult(s) for cefepime from an ED provider.  The patient's profile has been reviewed for ht/wt/allergies/indication/available labs.    One time order(s) placed for cefepime 2 g IV  Further antibiotics/pharmacy consults should be ordered by admitting physician if indicated.                       Thank you,  Pricilla Riffle, PharmD 10/30/2020  3:57 PM

## 2020-10-30 NOTE — ED Provider Notes (Addendum)
[facility] Emergency Department Provider Note  ____________________________________________   Event Date/Time   First MD Initiated Contact with Patient 10/30/20 1420     (approximate)  I have reviewed the triage vital signs and the nursing notes.   HISTORY  Chief Complaint Weakness   HPI Andrea [name] is a 65 y.o. 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  Patient's daughter notes she was recently diagnosed and treated for strep throat 3 weeks ago although she does not think this would explain patient's current presentation.  She does note patient lives at home with her extended family who take care of her.  History is very limited from patient secondary to her aphasia.  Per daughter she has been coughing little bit but has not had any vomiting diarrhea or recent falls or injuries.  No other acute sick symptoms per daughter.  Her daughter notes that while she has aphasia she is sometimes able to answer yes no but has been more confused lately.         Past Medical History:  Diagnosis Date  . Stroke [facility](HCC)     Patient Active Problem List   Diagnosis Date Noted  . Viral URI 10/14/2020  . Urinary incontinence due to immobility 10/17/2019  . Asthma 01/07/2019  . HLD (hyperlipidemia) 01/07/2019  . History of stroke 10/01/2018  . Aphasia as late effect of stroke 10/01/2018  . Right sided weakness 10/01/2018  . Essential hypertension 10/01/2018  . Type 2 diabetes mellitus with other specified complication (HCC) 10/01/2018  . Advanced care planning/counseling discussion 10/01/2018    Past Surgical History:  Procedure Laterality Date  . BREAST BIOPSY Left   . CESAREAN SECTION     4   . CRANIOPLASTY Left 08/08/2017  . CRANIOTOMY FOR HEMISPHERECTOMY  TOTAL / PARTIAL Left 02/16/2017  . PATELLA FRACTURE SURGERY Right   . PERCUTANEOUS ENDOSCOPIC GASTROSTOMY (PEG) REMOVAL    . TUBAL LIGATION      Prior to Admission medications   Medication Sig Start Date End Date Taking? Authorizing Provider  amantadine (SYMMETREL) 100 MG capsule Take 1 capsule (100 mg total) by mouth 2 (two) times daily. 01/30/20   Lynnda Childody, Jessica R, MD  amLODipine (NORVASC) 10 MG tablet Take 1 tablet (10 mg total) by mouth daily. 01/30/20   Lynnda Childody, Jessica R, MD  amoxicillin (AMOXIL) 500 MG capsule Take 1 capsule (500 mg total) by mouth 2 (two) times daily. 10/14/20   Tower, Audrie GallusMarne A, MD  aspirin EC 81 MG tablet Take 81 mg by mouth daily.    [provider]  Cholecalciferol 50 MCG (2000 UT) CAPS Take by mouth.    [provider]  hydrALAZINE (APRESOLINE) 10 MG tablet Take 2 tablets (20 mg total) by mouth every 8 (eight) hours as needed. 01/30/20   Lynnda Childody, Jessica R, MD  metoprolol tartrate (LOPRESSOR) 25 MG tablet Take 0.5 tablets (12.5 mg total) by mouth 2 (two) times daily. 01/30/20   Lynnda Childody, Jessica R, MD  Multiple Vitamins-Minerals (CENTRUM SILVER PO) Take 1 tablet by mouth daily.    [provider]  OVER THE COUNTER MEDICATION OMEGA 3 FISH OIL. 2000 MG. 1 DAILY    [provider]  pravastatin (PRAVACHOL) 80 MG tablet Take 1 tablet (80 mg total) by mouth daily. 01/30/20   Gweneth Dimitriody, Jessica  R, MD  Probiotic Product (PROBIOTIC-10 PO) Take by mouth. 1 tablet daily    [provider]  senna (SENOKOT) 8.6 MG tablet Take 1 tablet by mouth daily.    [provider]  vitamin C (ASCORBIC ACID) 500 MG tablet Take 500 mg by mouth daily.    [provider]    Allergies Citric acid  Family History  Problem Relation Age of Onset  . Stroke Mother   . Diabetes Mother   . Hypertension Mother   . Aneurysm Father   . Diabetes Sister   . Hypertension Sister   . Diabetes Sister   . Hypertension Sister   . Hypertension Sister   .  Hypertension Sister     Social History Social History   Tobacco Use  . Smoking status: Former Smoker    Quit date: 12/2016    Years since quitting: 3.9  . Smokeless tobacco: Never Used    Review of Systems  Review of Systems  Unable to perform ROS: Mental status change      ____________________________________________   PHYSICAL EXAM:  VITAL SIGNS: ED Triage Vitals  Enc Vitals Group     BP 10/30/20 1224 115/85     Pulse Rate 10/30/20 1224 (!) 121     Resp 10/30/20 1224 16     Temp 10/30/20 1224 98 F (36.7 C)     Temp Source 10/30/20 1224 Oral     SpO2 10/30/20 1224 98 %     Weight 10/30/20 1225 134 lb (60.8 kg)     Height 10/30/20 1225 5\' 5"  (1.651 m)     Head Circumference --      Peak Flow --      Pain Score --      Pain Loc --      Pain Edu? --      Excl. in GC? --    Vitals:   10/30/20 1224  BP: 115/85  Pulse: (!) 121  Resp: 16  Temp: 98 F (36.7 C)  SpO2: 98%   Physical Exam Vitals and nursing note reviewed.  Constitutional:      General: She is in acute distress.     Appearance: She is well-developed and well-nourished. She is ill-appearing.  HENT:     Head: Normocephalic and atraumatic.     Right Ear: External ear normal.     Left Ear: External ear normal.     Nose: Nose normal.     Mouth/Throat:     Mouth: Mucous membranes are dry.  Eyes:     Conjunctiva/sclera: Conjunctivae normal.  Cardiovascular:     Rate and Rhythm: Regular rhythm. Tachycardia present.     Heart sounds: No murmur heard.   Pulmonary:     Effort: Pulmonary effort is normal. No respiratory distress.     Breath sounds: Normal breath sounds.  Abdominal:     Palpations: Abdomen is soft.     Tenderness: There is no abdominal tenderness.  Musculoskeletal:        General: No edema.     Cervical back: Neck supple.  Skin:    General: Skin is warm and dry.     Capillary Refill: Capillary refill takes more than 3 seconds.  Neurological:     Mental Status: She is  alert. She is confused.     Motor: Tremor present.  Psychiatric:        Mood and Affect: Mood and affect normal.     Patient does not move her right  upper or lower extremities on command.  She is able to grip me with her left hand.  2+ bilateral radial pulses.  Oropharynx is dry but otherwise unremarkable.  Patient does not appear meningitic although she has significant tremors throughout her bilateral upper extremities.  She is not able to participate in interview secondary to aphasia. ____________________________________________   LABS (all labs ordered are listed, but only abnormal results are displayed)  Labs Reviewed  BASIC METABOLIC PANEL - Abnormal; Notable for the following components:      Result Value   Sodium 153 (*)    Chloride 117 (*)    CO2 17 (*)    Glucose, Bld 165 (*)    BUN 37 (*)    Creatinine, Ser 3.36 (*)    Calcium 10.7 (*)    GFR, Estimated 15 (*)    Anion gap 19 (*)    All other components within normal limits  CBC - Abnormal; Notable for the following components:   WBC 12.1 (*)    Hemoglobin 11.9 (*)    Platelets 473 (*)    All other components within normal limits  URINALYSIS, COMPLETE (UACMP) WITH MICROSCOPIC - Abnormal; Notable for the following components:   Color, Urine YELLOW (*)    APPearance CLOUDY (*)    Glucose, UA >=500 (*)    Ketones, ur 5 (*)    Protein, ur >=300 (*)    Bacteria, UA RARE (*)    All other components within normal limits  LACTIC ACID, PLASMA - Abnormal; Notable for the following components:   Lactic Acid, Venous 5.0 (*)    All other components within normal limits  HEPATIC FUNCTION PANEL - Abnormal; Notable for the following components:   Total Protein 9.0 (*)    Albumin 3.3 (*)    Total Bilirubin 1.3 (*)    All other components within normal limits  BLOOD GAS, VENOUS - Abnormal; Notable for the following components:   pCO2, Ven 28 (*)    pO2, Ven 54.0 (*)    Bicarbonate 16.2 (*)    Acid-base deficit 7.7 (*)     All other components within normal limits  CBG MONITORING, ED - Abnormal; Notable for the following components:   Glucose-Capillary 198 (*)    All other components within normal limits  SARS CORONAVIRUS 2 BY RT PCR (HOSPITAL ORDER, PERFORMED IN [facility])  URINE CULTURE  CULTURE, BLOOD (ROUTINE X 2)  CULTURE, BLOOD (ROUTINE X 2)  GROUP A STREP BY PCR  PROTIME-INR  APTT  LACTIC ACID, PLASMA  MAGNESIUM  BETA-HYDROXYBUTYRIC ACID   ____________________________________________  EKG  Sinus tachycardia with a ventricular rate of 113, normal axis, unremarkable intervals, diffuse tremor artifact and nonspecific changes throughout. ____________________________________________  RADIOLOGY  ED MD interpretation: Chest x-ray shows no evidence of pneumonia pneumothorax or other acute intrathoracic process.  CT head has evidence of prior stroke and craniotomy without any other clear acute intracranial process.  Slightly displaced cranial flap.   Official radiology report(s): CT Head Wo Contrast  Result Date: 10/30/2020 CLINICAL DATA:  Mental status change. EXAM: CT HEAD WITHOUT CONTRAST TECHNIQUE: Contiguous axial images were obtained from the base of the skull through the vertex without intravenous contrast. COMPARISON:  None. FINDINGS: Brain: Encephalomalacia throughout most of the left MCA territory compatible with chronic infarct. This extends into the basal ganglia. Compensatory enlargement left lateral ventricle. Negative for hydrocephalus. Mild white matter changes in the right hemisphere Negative for acute infarct, hemorrhage, mass Vascular: Negative for  hyperdense vessel Skull: Left-sided craniotomy. Cranioplasty flap is not well attached to the calvarium and is displaced medially particularly along the inferior and posterior margins of the craniotomy. Bony resorption of the craniotomy flap. Sinuses/Orbits: Air-fluid level in the right sphenoid sinus. Remaining sinuses clear.  Other: None IMPRESSION: No prior study. Chronic left MCA infarct. No acute infarct or hemorrhage Left craniotomy. Cranioplasty flap is displaced medially with loss of attachment to the calvarium. Electronically Signed   By: Marlan Palau M.D.   On: 10/30/2020 16:07   DG Chest Port 1 View  Result Date: 10/30/2020 CLINICAL DATA:  Sepsis. EXAM: PORTABLE CHEST 1 VIEW COMPARISON:  None. FINDINGS: The patient is rotated to the right on today's radiograph, reducing diagnostic sensitivity and specificity. Heart size within normal limits. The lungs appear clear. No blunting of the costophrenic angles. A pulmonary cause for sepsis is not identified. IMPRESSION: 1. No active cardiopulmonary disease is radiographically apparent. Electronically Signed   By: Gaylyn Rong M.D.   On: 10/30/2020 15:02    ____________________________________________   PROCEDURES  Procedure(s) performed (including Critical Care):  .Critical Care Performed by: Gilles Chiquito, MD Authorized by: Gilles Chiquito, MD   Critical care provider statement:    Critical care time (minutes):  45   Critical care was necessary to treat or prevent imminent or life-threatening deterioration of the following conditions:  Shock, sepsis and dehydration   Critical care was time spent personally by me on the following activities:  Discussions with consultants, evaluation of patient's response to treatment, examination of patient, ordering and performing treatments and interventions, ordering and review of laboratory studies, ordering and review of radiographic studies, pulse oximetry, re-evaluation of patient's condition, obtaining history from patient or surrogate and review of old charts   I assumed direction of critical care for this patient from another provider in my specialty: no    .1-3 Lead EKG Interpretation Performed by: Gilles Chiquito, MD Authorized by: Gilles Chiquito, MD     Interpretation: abnormal     ECG rate  assessment: tachycardic     Rhythm: sinus rhythm     Ectopy: none     Conduction: normal       ____________________________________________   INITIAL IMPRESSION / ASSESSMENT AND PLAN / ED COURSE      Patient presents with post history exam accompanied by daughter for assessment of 3 weeks of decreased appetite worsening confusion and worsening tremors.  On arrival patient is tachycardic with stable vital signs on room air.  On exam patient is unable provide any history herself and seems this is slightly worse than baseline she is sometimes able to answer yes/no questions.  Does not appear to have any new focal deficit on exam and there is no history or exam findings to suggest acute traumatic injury.  Very low suspicion for toxic ingestion.  Differential includes but is not limited to CVA, acute infection, dehydration/metabolic derangements, and arrhythmia.  Chest x-ray obtained shows no evidence of pneumonia, pneumothorax, edema, effusion or other clear acute thoracic process.  BMP remarkable for hyponatremia with NA of 153, hypochloremia with a chloride of 117, acidosis with a bicarb of 17 and anion gap of 19 as well as hyperglycemia with a glucose of 165 and a creatinine of 3.36 compared to 0.99 9 months ago.  CBC has slight leukocytosis with WBC count of 12.1 with mild anemia and no other significant derangements.  VBG with a pH of 7.37 with a PCO2 of 28 and a  bicarb of 16.2.  This is not consistent with DKA despite anion gap acidosis on CMP and mild hyperglycemia. However, will sent beta-hydroxy to help delinate if mild DKA contributing to lactic acidosis. Will give sliding scale insulin at this time and defer drip.  UA does not appear clearly infected although does have 21-50 WBCs and rare bacteria.  Patient also known to have greater than 500 glucose and 300 protein.  Lactic acid is elevated at 5.  We will plan to obtain CT head as well to assess for any other clear acute intracranial  process although given above labs and more concern for possible sepsis versus severe dehydration.  Obvious source of infection at this time and oropharynx otherwise on exam.  UA is not floridly infected but somewhat difficult to exclude infection at this time.  In addition to obtaining blood and urine cultures will give patient broad-spectrum antibiotics and give 30 cc/kg of IV fluids.  CT head does not appear to show acute intracranial process though does show slightly displaced cranial flap return suspect is subacute to chronic and likely not contributing to patient's sepsis.  I will plan to admit to medicine service for further evaluation management. ____________________________________________   FINAL CLINICAL IMPRESSION(S) / ED DIAGNOSES  Final diagnoses:  Sepsis, due to unspecified organism, unspecified whether acute organ dysfunction present (HCC)  Shock (HCC)  AKI (acute kidney injury) (HCC)  Lactic acid acidosis  Acute cystitis without hematuria  Hemiparesis, unspecified hemiparesis etiology, unspecified laterality (HCC)  Hyperglycemia  Hypernatremia    Medications  insulin aspart (novoLOG) injection 0-15 Units (has no administration in time range)  lactated ringers infusion (has no administration in time range)  cefTRIAXone (ROCEPHIN) 1 g in sodium chloride 0.9 % 100 mL IVPB (has no administration in time range)  lactated ringers bolus 1,824 mL (1,824 mLs Intravenous New Bag/Given 10/30/20 1506)     ED Discharge Orders    None       Note:  This document was prepared using Dragon voice recognition software and may include unintentional dictation errors.   Gilles Chiquito, MD 10/30/20 1613    Gilles Chiquito, MD 10/30/20 1622    Gilles Chiquito, MD 10/30/20 1625    Gilles Chiquito, MD 10/30/20 1627    Gilles Chiquito, MD 10/30/20 [phone]

## 2020-10-30 NOTE — ED Notes (Signed)
Pt with strong tremors while this RN has been assigned. Caregiver at bedside states this is not normal for this pt. Reports that pt usually is responsive, able to ambulate with quad-cane, and does not have such violent tremulousness. Pt on left side is quite rigid, with left arm usually gripping something like the sheet or bedrail tightly. Pt does not seem to be able to easily control this, and effort to stop makes tremors worse. Pt right leg is slightly less effected.  Pt right side is mostly limp. Right leg has brace, some movement but not enough to control or bear weight.

## 2020-10-30 NOTE — ED Triage Notes (Signed)
Pt to ER with caregiver with c/o increasing weakness.  States was treated for strep approximately 3 weeks ago and continues to have difficulty swallowing and is not taking medications well at this time.  States pt has new tremor and has been confused the last few days.

## 2020-10-30 NOTE — Sepsis Progress Note (Signed)
eLink is monitoring this Code Sepsis. °

## 2020-10-31 DIAGNOSIS — G928 Other toxic encephalopathy: Secondary | ICD-10-CM | POA: Diagnosis present

## 2020-10-31 DIAGNOSIS — R5381 Other malaise: Secondary | ICD-10-CM | POA: Diagnosis not present

## 2020-10-31 DIAGNOSIS — R627 Adult failure to thrive: Secondary | ICD-10-CM | POA: Diagnosis present

## 2020-10-31 DIAGNOSIS — R531 Weakness: Secondary | ICD-10-CM | POA: Diagnosis present

## 2020-10-31 DIAGNOSIS — R6520 Severe sepsis without septic shock: Secondary | ICD-10-CM | POA: Diagnosis not present

## 2020-10-31 DIAGNOSIS — F0390 Unspecified dementia without behavioral disturbance: Secondary | ICD-10-CM | POA: Diagnosis present

## 2020-10-31 DIAGNOSIS — E785 Hyperlipidemia, unspecified: Secondary | ICD-10-CM | POA: Diagnosis present

## 2020-10-31 DIAGNOSIS — E1165 Type 2 diabetes mellitus with hyperglycemia: Secondary | ICD-10-CM | POA: Diagnosis present

## 2020-10-31 DIAGNOSIS — J189 Pneumonia, unspecified organism: Secondary | ICD-10-CM | POA: Diagnosis present

## 2020-10-31 DIAGNOSIS — N179 Acute kidney failure, unspecified: Secondary | ICD-10-CM | POA: Diagnosis present

## 2020-10-31 DIAGNOSIS — I69351 Hemiplegia and hemiparesis following cerebral infarction affecting right dominant side: Secondary | ICD-10-CM | POA: Diagnosis not present

## 2020-10-31 DIAGNOSIS — E2740 Unspecified adrenocortical insufficiency: Secondary | ICD-10-CM | POA: Diagnosis present

## 2020-10-31 DIAGNOSIS — K5909 Other constipation: Secondary | ICD-10-CM | POA: Diagnosis present

## 2020-10-31 DIAGNOSIS — R000 Tachycardia, unspecified: Secondary | ICD-10-CM | POA: Diagnosis present

## 2020-10-31 DIAGNOSIS — E11649 Type 2 diabetes mellitus with hypoglycemia without coma: Secondary | ICD-10-CM | POA: Diagnosis not present

## 2020-10-31 DIAGNOSIS — E870 Hyperosmolality and hypernatremia: Secondary | ICD-10-CM | POA: Diagnosis present

## 2020-10-31 DIAGNOSIS — R1310 Dysphagia, unspecified: Secondary | ICD-10-CM | POA: Diagnosis present

## 2020-10-31 DIAGNOSIS — Z20822 Contact with and (suspected) exposure to covid-19: Secondary | ICD-10-CM | POA: Diagnosis present

## 2020-10-31 DIAGNOSIS — R6510 Systemic inflammatory response syndrome (SIRS) of non-infectious origin without acute organ dysfunction: Secondary | ICD-10-CM | POA: Diagnosis present

## 2020-10-31 DIAGNOSIS — E860 Dehydration: Secondary | ICD-10-CM | POA: Diagnosis present

## 2020-10-31 DIAGNOSIS — I69320 Aphasia following cerebral infarction: Secondary | ICD-10-CM | POA: Diagnosis not present

## 2020-10-31 DIAGNOSIS — E876 Hypokalemia: Secondary | ICD-10-CM | POA: Diagnosis present

## 2020-10-31 DIAGNOSIS — E872 Acidosis: Secondary | ICD-10-CM | POA: Diagnosis present

## 2020-10-31 DIAGNOSIS — R7989 Other specified abnormal findings of blood chemistry: Secondary | ICD-10-CM | POA: Diagnosis present

## 2020-10-31 DIAGNOSIS — Z87891 Personal history of nicotine dependence: Secondary | ICD-10-CM | POA: Diagnosis not present

## 2020-10-31 DIAGNOSIS — I10 Essential (primary) hypertension: Secondary | ICD-10-CM | POA: Diagnosis present

## 2020-10-31 DIAGNOSIS — A419 Sepsis, unspecified organism: Secondary | ICD-10-CM | POA: Diagnosis present

## 2020-10-31 LAB — CBC
HCT: 33.1 % — ABNORMAL LOW (ref 36.0–46.0)
Hemoglobin: 10.3 g/dL — ABNORMAL LOW (ref 12.0–15.0)
MCH: 26.8 pg (ref 26.0–34.0)
MCHC: 31.1 g/dL (ref 30.0–36.0)
MCV: 86.2 fL (ref 80.0–100.0)
Platelets: 391 10*3/uL (ref 150–400)
RBC: 3.84 MIL/uL — ABNORMAL LOW (ref 3.87–5.11)
RDW: 15.7 % — ABNORMAL HIGH (ref 11.5–15.5)
WBC: 15.9 10*3/uL — ABNORMAL HIGH (ref 4.0–10.5)
nRBC: 0 % (ref 0.0–0.2)

## 2020-10-31 LAB — CBG MONITORING, ED
Glucose-Capillary: 133 mg/dL — ABNORMAL HIGH (ref 70–99)
Glucose-Capillary: 45 mg/dL — ABNORMAL LOW (ref 70–99)
Glucose-Capillary: 142 mg/dL — ABNORMAL HIGH (ref 70–99)
Glucose-Capillary: 127 mg/dL — ABNORMAL HIGH (ref 70–99)

## 2020-10-31 LAB — GLUCOSE, CAPILLARY
Glucose-Capillary: 167 mg/dL — ABNORMAL HIGH (ref 70–99)
Glucose-Capillary: 136 mg/dL — ABNORMAL HIGH (ref 70–99)
Glucose-Capillary: 208 mg/dL — ABNORMAL HIGH (ref 70–99)

## 2020-10-31 LAB — FOLATE: Folate: 37 ng/mL (ref >5.9)

## 2020-10-31 LAB — PROCALCITONIN: Procalcitonin: 0.6 ng/mL

## 2020-10-31 LAB — COMPREHENSIVE METABOLIC PANEL
ALT: 22 U/L (ref 0–44)
AST: 40 U/L (ref 15–41)
Albumin: 2.9 g/dL — ABNORMAL LOW (ref 3.5–5.0)
Alkaline Phosphatase: 65 U/L (ref 38–126)
Anion gap: 20 — ABNORMAL HIGH (ref 5–15)
BUN: 32 mg/dL — ABNORMAL HIGH (ref 8–23)
CO2: 16 mmol/L — ABNORMAL LOW (ref 22–32)
Calcium: 9.8 mg/dL (ref 8.9–10.3)
Chloride: 122 mmol/L — ABNORMAL HIGH (ref 98–111)
Creatinine, Ser: 2.84 mg/dL — ABNORMAL HIGH (ref 0.44–1.00)
GFR, Estimated: 18 mL/min — ABNORMAL LOW (ref >60)
Glucose, Bld: 42 mg/dL — CL (ref 70–99)
Potassium: 3.5 mmol/L (ref 3.5–5.1)
Sodium: 158 mmol/L — ABNORMAL HIGH (ref 135–145)
Total Bilirubin: 1.5 mg/dL — ABNORMAL HIGH (ref 0.3–1.2)
Total Protein: 7.6 g/dL (ref 6.5–8.1)

## 2020-10-31 LAB — TSH: TSH: 0.695 u[IU]/mL (ref 0.350–4.500)

## 2020-10-31 LAB — PROTIME-INR
INR: 1.2 (ref 0.8–1.2)
Prothrombin Time: 14.3 seconds (ref 11.4–15.2)

## 2020-10-31 LAB — T4, FREE: Free T4: 1.16 ng/dL — ABNORMAL HIGH (ref 0.61–1.12)

## 2020-10-31 LAB — HIV ANTIBODY (ROUTINE TESTING W REFLEX): HIV Screen 4th Generation wRfx: NONREACTIVE

## 2020-10-31 LAB — AMMONIA: Ammonia: 16 umol/L (ref 9–35)

## 2020-10-31 MED ORDER — INSULIN ASPART 100 UNIT/ML ~~LOC~~ SOLN
0.0000 [IU] | SUBCUTANEOUS | Status: DC
  Administered 2020-10-31: 2 [IU] via SUBCUTANEOUS
  Administered 2020-11-01: 1 [IU] via SUBCUTANEOUS
  Administered 2020-11-01 (×3): 2 [IU] via SUBCUTANEOUS
  Administered 2020-11-01 – 2020-11-03 (×7): 1 [IU] via SUBCUTANEOUS
  Filled 2020-10-31 (×10): qty 1

## 2020-10-31 MED ORDER — METOPROLOL TARTRATE 5 MG/5ML IV SOLN
5.0000 mg | Freq: Four times a day (QID) | INTRAVENOUS | Status: DC
  Administered 2020-10-31: 5 mg via INTRAVENOUS
  Filled 2020-10-31: qty 5

## 2020-10-31 MED ORDER — SENNA 8.6 MG PO TABS: 1.0000 | ORAL_TABLET | Freq: Every day | ORAL | Status: DC | PRN

## 2020-10-31 MED ORDER — METOPROLOL TARTRATE 5 MG/5ML IV SOLN: 5.0000 mg | Freq: Once | INTRAVENOUS | Status: DC

## 2020-10-31 MED ORDER — INSULIN ASPART 100 UNIT/ML ~~LOC~~ SOLN: 0.0000 [IU] | SUBCUTANEOUS | Status: DC

## 2020-10-31 MED ORDER — DEXTROSE-NACL 5-0.9 % IV SOLN: INTRAVENOUS | Status: DC

## 2020-10-31 MED ORDER — CLONIDINE HCL 0.1 MG/24HR TD PTWK
0.1000 mg | MEDICATED_PATCH | TRANSDERMAL | Status: DC
  Administered 2020-10-31: 0.1 mg via TRANSDERMAL
  Filled 2020-10-31: qty 1

## 2020-10-31 MED ORDER — DEXTROSE 50 % IV SOLN
50.0000 mL | Freq: Once | INTRAVENOUS | Status: AC
  Administered 2020-10-31: 50 mL via INTRAVENOUS
  Filled 2020-10-31: qty 50

## 2020-10-31 MED ORDER — LORAZEPAM 2 MG/ML IJ SOLN: 0.5000 mg | Freq: Four times a day (QID) | INTRAMUSCULAR | Status: DC | PRN

## 2020-10-31 MED ORDER — DEXTROSE-NACL 5-0.45 % IV SOLN: INTRAVENOUS | Status: DC

## 2020-10-31 MED ORDER — LACTATED RINGERS IV SOLN: INTRAVENOUS | Status: DC

## 2020-10-31 MED ORDER — SODIUM CHLORIDE 0.45 % IV BOLUS
1000.0000 mL | Freq: Once | INTRAVENOUS | Status: AC
  Administered 2020-10-31: 1000 mL via INTRAVENOUS

## 2020-10-31 MED ORDER — LACTATED RINGERS IV BOLUS
1000.0000 mL | Freq: Once | INTRAVENOUS | Status: AC
  Administered 2020-10-31: 1000 mL via INTRAVENOUS

## 2020-10-31 MED ORDER — INFLUENZA VAC SPLIT QUAD 0.5 ML IM SUSY: 0.5000 mL | PREFILLED_SYRINGE | INTRAMUSCULAR | Status: DC

## 2020-10-31 MED ORDER — LORAZEPAM 2 MG/ML IJ SOLN
1.0000 mg | Freq: Once | INTRAMUSCULAR | Status: AC
  Administered 2020-10-31: 1 mg via INTRAVENOUS
  Filled 2020-10-31: qty 1

## 2020-10-31 MED ORDER — METOPROLOL TARTRATE 5 MG/5ML IV SOLN
10.0000 mg | Freq: Four times a day (QID) | INTRAVENOUS | Status: DC
  Administered 2020-10-31: 10 mg via INTRAVENOUS
  Filled 2020-10-31 (×2): qty 10

## 2020-10-31 MED ORDER — HYDROCORTISONE NA SUCCINATE PF 100 MG IJ SOLR
50.0000 mg | Freq: Three times a day (TID) | INTRAMUSCULAR | Status: AC
  Administered 2020-10-31 – 2020-11-01 (×3): 50 mg via INTRAVENOUS
  Filled 2020-10-31 (×4): qty 1

## 2020-10-31 NOTE — Progress Notes (Signed)
Andrea [name]  DEY:[id number] DOB: 12/11/1955 DOA: 10/30/2020 PCP: [name], MD    Brief Narrative:  65 year old with a history of HTN, left MCA infarct status post craniotomy, chronic aphasia, and HLD who presented to the ED with severe failure to thrive over a 3-week period.  Family reported very poor intake with progressively worsening lethargy.  Her decline seems to have begun 10/14/2020 when she was diagnosed with strep throat.  Significant Events:  1/28 admit via [facility] ED  Antimicrobials:  Cefepime 1/28 > Vancomycin 1/28 >  DVT prophylaxis: Subcutaneous heparin  Subjective: Tremulous. Awake but only minimally able to interact. Appears acutely ill. No resp distress.   Assessment & Plan:  SIRS v/s Sepsis of unclear etiology POA CXR without evidence of focal infiltrate -UA abnormal in setting of severe dehydration but not suggestive of acute infection - no convincing source of infxn at present - perhaps a PNA will become more evident once she is better hydrated   Volume depletion - dehydration - hypernatremia -lactic acidosis Due to very limited intake -volume resuscitate aggressively and follow   Persistent sinus tachycardia  May simply be a consequence of her severe DH - querry adrenal insuff - could be sepsis related - volume resuscitate - trial dose of hydrocortisone after cortisol level drawn - BB   Transient hypoglycemia  Add dextrose to maintenance fluids   Acute renal failure Prerenal due to dehydration -volume expand and follow trend -baseline creatinine 0.10 January 2020  Toxic metabolic encephalopathy -severe generalized failure to thrive CT head without acute findings - ?seizure activity - check EEG   Hx of CVA - chronic aphasia and R hemiparesis   HTN  HLD  Chronic constipation  Dementia   Code Status: FULL CODE Family Communication:  Status is: Inpatient  Remains inpatient appropriate because:Inpatient level of care appropriate due to severity of  illness   Dispo: The patient is from: Home              Anticipated d/c is to: unclear              Anticipated d/c date is: 3 days              Patient currently is not medically stable to d/c.   Difficult to place patient No   Consultants:  none  Objective: Blood pressure (!) 166/100, pulse (!) 130, temperature (!) 101 F (38.3 C), temperature source Oral, resp. rate 20, height 5\' 5"  (1.651 m), weight 60.8 kg, SpO2 100 %.  Intake/Output Summary (Last 24 hours) at 10/31/2020 1541 Last data filed at 10/31/2020 1352 Gross per 24 hour  Intake 1100 ml  Output 1800 ml  Net -700 ml   Filed Weights   10/30/20 1225  Weight: 60.8 kg    Examination: General: No acute respiratory distress - lethargic - tremulous  Lungs: Clear to auscultation bilaterally  Cardiovascular: tachycardic - regular - no M or rub  Abdomen: Nontender, nondistended, soft, bowel sounds positive, no rebound, no ascites, no appreciable mass Extremities: No significant cyanosis, clubbing, or edema bilateral lower extremities  CBC: Recent Labs  Lab 10/30/20 1227 10/31/20 0518  WBC 12.1* 15.9*  HGB 11.9* 10.3*  HCT 36.9 33.1*  MCV 83.5 86.2  PLT 473* 391   Basic Metabolic Panel: Recent Labs  Lab 10/30/20 1227 10/31/20 0518  NA 153* 158*  K 4.1 3.5  CL 117* 122*  CO2 17* 16*  GLUCOSE 165* 42*  BUN 37* 32*  CREATININE 3.36* 2.84*  CALCIUM 10.7* 9.8  MG 2.6*  --    GFR: Estimated Creatinine Clearance: 18.2 mL/min (A) (by C-G formula based on SCr of 2.84 mg/dL (H)).  Liver Function Tests: Recent Labs  Lab 10/30/20 1438 10/31/20 0518  AST 36 40  ALT 21 22  ALKPHOS 77 65  BILITOT 1.3* 1.5*  PROT 9.0* 7.6  ALBUMIN 3.3* 2.9*    Recent Labs  Lab 10/31/20 1300  AMMONIA 16    Coagulation Profile: Recent Labs  Lab 10/30/20 1438 10/31/20 0518  INR 1.1 1.2   HbA1C: Hemoglobin A1C  Date/Time Value Ref Range Status  08/31/2020 02:23 PM 6.8 (A) 4.0 - 5.6 % Final  01/30/2020 04:22 PM  6.2 (A) 4.0 - 5.6 % Final   Hgb A1c MFr Bld  Date/Time Value Ref Range Status  04/15/2019 03:29 PM 5.9 4.6 - 6.5 % Final    Comment:    Glycemic Control Guidelines for People with Diabetes:Non Diabetic:  <6%Goal of Therapy: <7%Additional Action Suggested:  >8%     CBG: Recent Labs  Lab 10/30/20 2239 10/31/20 0215 10/31/20 0510 10/31/20 0805 10/31/20 1300  GLUCAP 118* 133* 45* 142* 127*    Recent Results (from the past 240 hour(s))  Blood culture (routine x 2)     Status: None (Preliminary result)   Collection Time: 10/30/20  2:38 PM   Specimen: BLOOD  Result Value Ref Range Status   Specimen Description BLOOD BLOOD LEFT FOREARM  Final   Special Requests   Final    BOTTLES DRAWN AEROBIC AND ANAEROBIC Blood Culture adequate volume   Culture   Final    NO GROWTH < 24 HOURS Performed at [facility], [address], [place], [place] [address]    Report Status PENDING  Incomplete  Blood culture (routine x 2)     Status: None (Preliminary result)   Collection Time: 10/30/20  2:38 PM   Specimen: BLOOD  Result Value Ref Range Status   Specimen Description BLOOD LEFT ANTECUBITAL  Final   Special Requests   Final    BOTTLES DRAWN AEROBIC AND ANAEROBIC Blood Culture results may not be optimal due to an inadequate volume of blood received in culture bottles   Culture   Final    NO GROWTH < 24 HOURS Performed at [facility], [address] [place], [place] [address]    Report Status PENDING  Incomplete  SARS Coronavirus 2 by RT PCR (hospital order, performed in [facility] hospital lab) Nasopharyngeal Nasopharyngeal Swab     Status: None   Collection Time: 10/30/20  2:38 PM   Specimen: Nasopharyngeal Swab  Result Value Ref Range Status   SARS Coronavirus 2 NEGATIVE NEGATIVE Final    Comment: (NOTE) SARS-CoV-2 target nucleic acids are NOT DETECTED.  The SARS-CoV-2 RNA is generally detectable in upper and lower respiratory specimens during the acute phase  of infection. The lowest concentration of SARS-CoV-2 viral copies this assay can detect is 250 copies / mL. A negative result does not preclude SARS-CoV-2 infection and should not be used as the sole basis for treatment or other patient management decisions.  A negative result may occur with improper specimen collection / handling, submission of specimen other than nasopharyngeal swab, presence of viral mutation(s) within the areas targeted by this assay, and inadequate number of viral copies (<250 copies / mL). A negative result must be combined with clinical observations, patient history, and epidemiological information.  Fact Sheet for Patients:   BoilerBrush.com.cy  Fact Sheet  for Healthcare Providers: https://pope.com/  This test is not yet approved or  cleared by the [place] and has been authorized for detection and/or diagnosis of SARS-CoV-2 by FDA under an Emergency Use Authorization (EUA).  This EUA will remain in effect (meaning this test can be used) for the duration of the COVID-19 declaration under Section 564(b)(1) of the Act, 21 U.S.C. section 360bbb-3(b)(1), unless the authorization is terminated or revoked sooner.  Performed at [facility], [address] [place], [place] [address]   Group A Strep by PCR [facility] Only)     Status: None   Collection Time: 10/30/20  2:38 PM   Specimen: Throat; Sterile Swab  Result Value Ref Range Status   Group A Strep by PCR NOT DETECTED NOT DETECTED Final    Comment: Performed at [facility], [address] [place], [place] [address]     Scheduled Meds: . amantadine  100 mg Oral BID  . heparin  5,000 Units Subcutaneous Q8H  . insulin aspart  0-15 Units Subcutaneous Q4H  . metoprolol tartrate  5 mg Intravenous Q6H  . polyethylene glycol  17 g Oral BID  . vancomycin variable dose per unstable renal function (pharmacist dosing)   Does not apply See  admin instructions   Continuous Infusions: . ceFEPime (MAXIPIME) IV Stopped (10/30/20 2107)  . dextrose 5 % and 0.9% NaCl 150 mL/hr at 10/31/20 1439     LOS: 0 days   Lonia Blood, MD [facility] Office  [phone] Pager - Text Page per Amion  If 7PM-7AM, please contact night-coverage per Amion 10/31/2020, 3:41 PM

## 2020-10-31 NOTE — Progress Notes (Signed)
Pharmacy Antibiotic Note  Andrea [name] is a 65 y.o. female admitted on 10/30/2020 with sepsis of unknown source. 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 Pharmacy has been consulted for vancomycin and cefepime dosing. Scr 3.36 on admission; based on past labs, baseline looks to be closer to 1. WBC 12.1, lactic acid 5>1.6, afebrile.     Plan: Cefepime 2g IV q24h  Vancomycin 1500mg  IV x1. Renal function improved. Will check random vanc level with morning labs and dose vanc accordingly.   Height: 5\' 5"  (165.1 cm) Weight: 60.8 kg (134 lb) IBW/kg (Calculated) : 57  Temp (24hrs), Avg:99.5 F (37.5 C), Min:98 F (36.7 C), Max:100.4 F (38 C)  Recent Labs  Lab 10/30/20 1227 10/30/20 1438 10/30/20 1713 10/31/20 0518  WBC 12.1*  --   --  15.9*  CREATININE 3.36*  --   --  2.84*  LATICACIDVEN  --  5.0* 1.6  --     Estimated Creatinine Clearance: 18.2 mL/min (A) (by C-G formula based on SCr of 2.84 mg/dL (H)).    Allergies  Allergen Reactions  . Citric Acid Hives    Antimicrobials this admission: 1/28 ceftriaxone x1 1/28 cefepime >>  1/28 metronidazole >> 1/28 vancomycin >>   Microbiology results: 1/28 BCx: NG 1/28 UCx: pending  Thank you for allowing pharmacy to be a part of this patient's care.  2/28, PharmD Clinical Pharmacist 10/31/2020 11:12 AM

## 2020-10-31 NOTE — Evaluation (Signed)
Clinical/Bedside Swallow Evaluation Patient Details  Name: Andrea [name] MRN: [id number] Date of Birth: 1956/08/19  Today's Date: 10/31/2020 Time: SLP Start Time (ACUTE ONLY): 1030 SLP Stop Time (ACUTE ONLY): 1100 SLP Time Calculation (min) (ACUTE ONLY): 30 min  Past Medical History:  Past Medical History:  Diagnosis Date  . Stroke [facility])    Past Surgical History:  Past Surgical History:  Procedure Laterality Date  . BREAST BIOPSY Left   . CESAREAN SECTION     4   . CRANIOPLASTY Left 08/08/2017  . CRANIOTOMY FOR HEMISPHERECTOMY TOTAL / PARTIAL Left 02/16/2017  . PATELLA FRACTURE SURGERY Right   . PERCUTANEOUS ENDOSCOPIC GASTROSTOMY (PEG) REMOVAL    . TUBAL LIGATION     HPI:  Patient is a 65 y.o. female with PMHx of HTN, L MCA infarct ~3 years prior s/p L craniotomy and aphasia who presents to the ED d/t poor PO intake and weakness for last ~3 weeks. Patient with dx of sepsis with unknown source. Per daughter report, patient had dysphagia and swallowing test via MBSS about 3 years ago following her stroke and "worked her way up" to a regular diet. She had no food/liquid consistency restrictions at home and still consumed a regular/thin liquid diet up until ~3 weeks ago when she started to demonstrated difficulty/refused PO intake. Per today's RN report, Yale swallow screen has not been completed d/t patient's fluctuating status and concerns for choking/aspiration with PO intake. RN also reported that RN last night provided patient with crushed meds in ice cream without noted difficulty, otherwise she has been NPO. CT 1/28 stated no acute infarct or hemmorrhage. CXR 1/28 stated no acute cardiopulmonary disease. SLP to complete BSE today.   Assessment / Plan / Recommendation Clinical Impression  Patient presents with moderate oral dysphagia with suspected pharyngeal dysphagia c/b cognitive feeding deficits, prolonged oral holding with suspected prolonged oral transit with all consistencies  (small ice chips, tsp thins, spoon-dips of puree), and minimal laryngeal movement noted with all consistencies. Unable to complete thorough oral motor examination d/t patient's difficulty following multi-step directions, however did intermittently follow basic 1-step directions given verbal/visual cues with adequate oral acceptance of all consistencies via spoon.   Due to patient's cognitive status, decline in functional status for past ~3 weeks, hx of dysphagia, hx of CVA, and current presentation today, patient with increased risk for choking/aspiration with PO intake. SLP recommends patient to continue NPO status - May have SMALL ice chips/sips of thin liquids following thorough oral care for comfort if patient is awake/alert/upright. Discontinue ice chips/thin liquids for comfort if patient demonstrates s/s of aspiration (I.e. coughing/choking, food spilling out of oral cavity, changes in respiratory status, etc). SLP also recommends f/u Modified Barium Swallow Study (MBSS) in next 2-3 days to determine presence of pharyngeal dysphagia. RN and daughter verbalized understanding and agreement to all education/recommendations. SLP left patient partially reclined in bed with call bell in reach and daughter present in room.   SLP Visit Diagnosis: Dysphagia, oral phase (R13.11)    Aspiration Risk  Moderate aspiration risk    Diet Recommendation NPO;Ice chips PRN after oral care   Liquid Administration via: Spoon Medication Administration: Other (Comment) (Per MD/RN discretion) Supervision: Full supervision/cueing for compensatory strategies Compensations: Minimize environmental distractions;Small sips/bites Postural Changes: Seated upright at 90 degrees    Other  Recommendations Oral Care Recommendations: Oral care prior to ice chip/H20   Follow up Recommendations Other (comment) (TBD)      Frequency and Duration min 3x week  2 weeks       Prognosis Prognosis for Safe Diet Advancement:  Guarded Barriers to Reach Goals: Cognitive deficits;Language deficits;Severity of deficits;Behavior      Swallow Study   General Date of Onset: 10/30/20 HPI: Patient is a 65 y.o. female with PMHx of HTN, L MCA infarct ~3 years prior s/p L craniotomy and aphasia who presents to the ED d/t poor PO intake and weakness for last ~3 weeks. Patient with dx of sepsis with unknown source. Per daughter report, patient had dysphagia and swallowing test via MBSS about 3 years ago following her stroke and "worked her way up" to a regular diet. She had no food/liquid consistency restrictions at home and still consumed a regular/thin liquid diet up until ~3 weeks ago when she started to demonstrated difficulty/refused PO intake. Per today's RN report, Yale swallow screen has not been completed d/t patient's fluctuating status and concerns for choking/aspiration with PO intake. RN also reported that RN last night provided patient with crusehd meds in ice cream without noted difficulty, otherwise she has been NPO. CT 1/28 stated no acute infarct or hemmorrhage. CXR 1/28 stated no acute cardiopulmonary disease. SLP to complete BSE today. Type of Study: Bedside Swallow Evaluation Previous Swallow Assessment: Per daughter report, patient had MBSS about 3 years ago and was diagnosed with dysphagia at that time. Diet Prior to this Study: NPO Respiratory Status: Room air History of Recent Intubation: No Behavior/Cognition: Alert;Agitated;Lethargic/Drowsy;Distractible Oral Cavity Assessment: Dry Oral Care Completed by SLP: Yes Oral Cavity - Dentition: Missing dentition Self-Feeding Abilities: Total assist Patient Positioning: Upright in bed Baseline Vocal Quality: Other (comment) (Adequate vocal output, however quality was "shaky" - possibly d/t tremors) Volitional Cough: Weak Volitional Swallow:  (laryngeal movement intermittently present)    Oral/Motor/Sensory Function Overall Oral Motor/Sensory Function: Moderate  impairment   Ice Chips Ice chips: Within functional limits Presentation: Spoon   Thin Liquid Thin Liquid: Within functional limits Presentation: Spoon    Nectar Thick Nectar Thick Liquid: Not tested   Honey Thick Honey Thick Liquid: Not tested   Puree Puree: Impaired Oral Phase Impairments: Reduced lingual movement/coordination Oral Phase Functional Implications: Oral holding   Solid     Solid: Not tested       Everlean Patterson, M.S. CCC-SLP Speech-Language Pathologist  Everlean Patterson 10/31/2020,11:36 AM

## 2020-10-31 NOTE — Progress Notes (Signed)
   10/31/20 1529  Vitals  Temp (!) 101 F (38.3 C)  BP (!) 166/100  MAP (mmHg) 118  BP Location Left Arm  BP Method Automatic  Patient Position (if appropriate) Lying  Pulse Rate (!) 130  Resp 20  MEWS COLOR  MEWS Score Color Red  Oxygen Therapy  SpO2 100 %  O2 Device Room Air  MEWS Score  MEWS Temp 1  MEWS Systolic 0  MEWS Pulse 3  MEWS RR 0  MEWS LOC 0  MEWS Score 4  Provider Notification  Provider Name/Title Jetty Duhamel MD  Date Provider Notified 10/31/20  Time Provider Notified 1543  Notification Type Page  Notification Reason Change in status (Also, MEWS)  Response See new orders  Date of Provider Response 10/31/20  Time of Provider Response 1553    Patient heart rate elevated 130s. Patient temp elevated. Change in status since assessment from previous hour. Patient tremors more severe. Patient's pupils nonreactive to light. Skin clammy. CBG checked, 167. Patient gripping side rail with left hand. MD paged for MEWS red and change in patient status. Primary RN and charge RN at bedside with ED RN Aundra Millet. Patient given Tylenol RE for fever see MAR. See MD new orders.

## 2020-10-31 NOTE — ED Notes (Signed)
Date and time results received: 10/31/20 0703 (use smartphrase ".now" to insert current time)  Test: glucose Critical Value: 42  Name of Provider Notified: Arville Care, MD  Orders Received? Or Actions Taken?: Actions Taken: given D50 IV

## 2020-11-01 ENCOUNTER — Inpatient Hospital Stay: Payer: Medicare Other

## 2020-11-01 DIAGNOSIS — R6520 Severe sepsis without septic shock: Secondary | ICD-10-CM | POA: Diagnosis not present

## 2020-11-01 DIAGNOSIS — A419 Sepsis, unspecified organism: Secondary | ICD-10-CM | POA: Diagnosis not present

## 2020-11-01 DIAGNOSIS — N179 Acute kidney failure, unspecified: Secondary | ICD-10-CM | POA: Diagnosis not present

## 2020-11-01 LAB — VANCOMYCIN, RANDOM: Vancomycin Rm: 13

## 2020-11-01 LAB — COMPREHENSIVE METABOLIC PANEL
ALT: 24 U/L (ref 0–44)
AST: 45 U/L — ABNORMAL HIGH (ref 15–41)
Albumin: 2.6 g/dL — ABNORMAL LOW (ref 3.5–5.0)
Alkaline Phosphatase: 57 U/L (ref 38–126)
Anion gap: 15 (ref 5–15)
BUN: 26 mg/dL — ABNORMAL HIGH (ref 8–23)
CO2: 16 mmol/L — ABNORMAL LOW (ref 22–32)
Calcium: 8.9 mg/dL (ref 8.9–10.3)
Chloride: 120 mmol/L — ABNORMAL HIGH (ref 98–111)
Creatinine, Ser: 2.15 mg/dL — ABNORMAL HIGH (ref 0.44–1.00)
GFR, Estimated: 25 mL/min — ABNORMAL LOW (ref >60)
Glucose, Bld: 243 mg/dL — ABNORMAL HIGH (ref 70–99)
Potassium: 3.6 mmol/L (ref 3.5–5.1)
Sodium: 151 mmol/L — ABNORMAL HIGH (ref 135–145)
Total Bilirubin: 1 mg/dL (ref 0.3–1.2)
Total Protein: 7.2 g/dL (ref 6.5–8.1)

## 2020-11-01 LAB — GLUCOSE, CAPILLARY
Glucose-Capillary: 142 mg/dL — ABNORMAL HIGH (ref 70–99)
Glucose-Capillary: 162 mg/dL — ABNORMAL HIGH (ref 70–99)
Glucose-Capillary: 166 mg/dL — ABNORMAL HIGH (ref 70–99)
Glucose-Capillary: 200 mg/dL — ABNORMAL HIGH (ref 70–99)
Glucose-Capillary: 217 mg/dL — ABNORMAL HIGH (ref 70–99)
Glucose-Capillary: 226 mg/dL — ABNORMAL HIGH (ref 70–99)
Glucose-Capillary: 238 mg/dL — ABNORMAL HIGH (ref 70–99)

## 2020-11-01 LAB — CBC
HCT: 35.5 % — ABNORMAL LOW (ref 36.0–46.0)
Hemoglobin: 11.5 g/dL — ABNORMAL LOW (ref 12.0–15.0)
MCH: 27.5 pg (ref 26.0–34.0)
MCHC: 32.4 g/dL (ref 30.0–36.0)
MCV: 84.9 fL (ref 80.0–100.0)
Platelets: 328 10*3/uL (ref 150–400)
RBC: 4.18 MIL/uL (ref 3.87–5.11)
RDW: 16.2 % — ABNORMAL HIGH (ref 11.5–15.5)
WBC: 12.2 10*3/uL — ABNORMAL HIGH (ref 4.0–10.5)
nRBC: 0 % (ref 0.0–0.2)

## 2020-11-01 LAB — URINE CULTURE

## 2020-11-01 LAB — FIBRIN DERIVATIVES D-DIMER (ARMC ONLY): Fibrin derivatives D-dimer (ARMC): 6317.98 ng/mL (FEU) — ABNORMAL HIGH (ref 0.00–499.00)

## 2020-11-01 LAB — CORTISOL: Cortisol, Plasma: 33.9 ug/dL

## 2020-11-01 LAB — MAGNESIUM: Magnesium: 1.9 mg/dL (ref 1.7–2.4)

## 2020-11-01 LAB — VITAMIN B12: Vitamin B-12: 1410 pg/mL — ABNORMAL HIGH (ref 180–914)

## 2020-11-01 MED ORDER — METOPROLOL TARTRATE 5 MG/5ML IV SOLN
5.0000 mg | Freq: Four times a day (QID) | INTRAVENOUS | Status: DC
  Administered 2020-11-01 – 2020-11-03 (×10): 5 mg via INTRAVENOUS
  Filled 2020-11-01 (×10): qty 5

## 2020-11-01 MED ORDER — VANCOMYCIN HCL IN DEXTROSE 1-5 GM/200ML-% IV SOLN
1000.0000 mg | Freq: Once | INTRAVENOUS | Status: AC
  Administered 2020-11-01: 1000 mg via INTRAVENOUS
  Filled 2020-11-01: qty 200

## 2020-11-01 NOTE — Progress Notes (Signed)
Andrea [name]  TZG:[id number] DOB: [birth year] DOA: 10/30/2020 PCP: [name], MD    Brief Narrative:  65 year old with a history of HTN, left MCA infarct status post craniotomy, chronic aphasia, and HLD who presented to the ED with severe failure to thrive over a 3-week period.  Family reported very poor intake with progressively worsening lethargy.  Her decline seems to have begun 10/14/2020 when she was diagnosed with strep throat.  Significant Events:  1/28 admit via [facility] ED  Antimicrobials:  Cefepime 1/28 > Vancomycin 1/28 >  DVT prophylaxis: Subcutaneous heparin  Subjective: Afebrile.  Heart rate has now normalized at 76-87 NSR.  Oxygen saturations 98% on room air. Appears much more comfortable on exam. No tremors noted. No resp distress.  Assessment & Plan:  RLL Pneumonia - Sepsis POA CXR without evidence of focal infiltrate at admission, but after hydration a RLL infiltrate has become apparent - UA abnormal in setting of severe dehydration but not suggestive of acute infection - cont empiric abx, with plan to narrow spectrum in AM if continues to do well   Volume depletion - dehydration - hypernatremia -lactic acidosis Due to very limited intake -improving but not yet fully volume resuscitated -continue IV fluid support  Persistent sinus tachycardia  a consequence of her severe DH and sepsis - serum cortisol not consistent with adrenal insuff -resolved with volume resuscitation/directed therapy for sepsis  Elevated d-dimer  Low clinical suspicion for PE - monitor sx for now   Diet controlled DM2 - Transient hypoglycemia  Now modestly hyperglycemic with use of dextrose in IV fluid - follow trend w/ SSI   Acute renal failure Prerenal due to dehydration - baseline creatinine 0.10 January 2020 -creatinine steadily improving with volume expansion  Recent Labs  Lab 10/30/20 1227 10/31/20 0518 11/01/20 0435  CREATININE 3.36* 2.84* 2.15*     Toxic metabolic  encephalopathy -severe generalized failure to thrive CT head without acute findings - ?seizure activity - check EEG when able if concern persists   Hx of CVA - chronic aphasia and R hemiparesis   HTN BP controlled   HLD Holding medical tx until intake improved  Chronic constipation  Dementia   Code Status: FULL CODE Family Communication: spoke w/ daughter at bedside  Status is: Inpatient  Remains inpatient appropriate because:Inpatient level of care appropriate due to severity of illness   Dispo: The patient is from: Home              Anticipated d/c is to: unclear              Anticipated d/c date is: 3 days              Patient currently is not medically stable to d/c.   Difficult to place patient No   Consultants:  none  Objective: Blood pressure (!) 133/94, pulse 84, temperature 98.8 F (37.1 C), resp. rate 16, height 5\' 5"  (1.651 m), weight 60.8 kg, SpO2 100 %.  Intake/Output Summary (Last 24 hours) at 11/01/2020 0955 Last data filed at 11/01/2020 11/03/2020 Gross per 24 hour  Intake 2427.86 ml  Output 2400 ml  Net 27.86 ml   Filed Weights   10/30/20 1225  Weight: 60.8 kg    Examination: General: No acute respiratory distress - calm Lungs: Clear to auscultation bilaterally - no wheezing  Cardiovascular: RRR - no M  Abdomen: NT/ND, soft, bs+, no mass  Extremities: No signif edema bilateral lower extremities  CBC: Recent Labs  Lab 10/30/20  1227 10/31/20 0518 11/01/20 0435  WBC 12.1* 15.9* 12.2*  HGB 11.9* 10.3* 11.5*  HCT 36.9 33.1* 35.5*  MCV 83.5 86.2 84.9  PLT 473* 391 328   Basic Metabolic Panel: Recent Labs  Lab 10/30/20 1227 10/31/20 0518 11/01/20 0435  NA 153* 158* 151*  K 4.1 3.5 3.6  CL 117* 122* 120*  CO2 17* 16* 16*  GLUCOSE 165* 42* 243*  BUN 37* 32* 26*  CREATININE 3.36* 2.84* 2.15*  CALCIUM 10.7* 9.8 8.9  MG 2.6*  --  1.9   GFR: Estimated Creatinine Clearance: 24.1 mL/min (A) (by C-G formula based on SCr of 2.15 mg/dL  (H)).  Liver Function Tests: Recent Labs  Lab 10/30/20 1438 10/31/20 0518 11/01/20 0435  AST 36 40 45*  ALT 21 22 24   ALKPHOS 77 65 57  BILITOT 1.3* 1.5* 1.0  PROT 9.0* 7.6 7.2  ALBUMIN 3.3* 2.9* 2.6*    Recent Labs  Lab 10/31/20 1300  AMMONIA 16    Coagulation Profile: Recent Labs  Lab 10/30/20 1438 10/31/20 0518  INR 1.1 1.2   HbA1C: Hemoglobin A1C  Date/Time Value Ref Range Status  08/31/2020 02:23 PM 6.8 (A) 4.0 - 5.6 % Final  01/30/2020 04:22 PM 6.2 (A) 4.0 - 5.6 % Final   Hgb A1c MFr Bld  Date/Time Value Ref Range Status  04/15/2019 03:29 PM 5.9 4.6 - 6.5 % Final    Comment:    Glycemic Control Guidelines for People with Diabetes:Non Diabetic:  <6%Goal of Therapy: <7%Additional Action Suggested:  >8%     CBG: Recent Labs  Lab 10/31/20 1815 10/31/20 2002 11/01/20 0001 11/01/20 0419 11/01/20 0750  GLUCAP 136* 208* 166* 217* 200*    Recent Results (from the past 240 hour(s))  Urine culture     Status: Abnormal   Collection Time: 10/30/20  2:38 PM   Specimen: Urine, Random  Result Value Ref Range Status   Specimen Description   Final    URINE, RANDOM Performed at [facility], [address], [place], [place]    Special Requests   Final    NONE Performed at [facility], [address] [place], [place]    Culture MULTIPLE SPECIES PRESENT, SUGGEST RECOLLECTION (A)  Final   Report Status 11/01/2020 FINAL  Final  Blood culture (routine x 2)     Status: None (Preliminary result)   Collection Time: 10/30/20  2:38 PM   Specimen: BLOOD  Result Value Ref Range Status   Specimen Description BLOOD BLOOD LEFT FOREARM  Final   Special Requests   Final    BOTTLES DRAWN AEROBIC AND ANAEROBIC Blood Culture adequate volume   Culture   Final    NO GROWTH 2 DAYS Performed at [facility], [address] [place], [place]    Report Status PENDING  Incomplete  Blood culture (routine x 2)      Status: None (Preliminary result)   Collection Time: 10/30/20  2:38 PM   Specimen: BLOOD  Result Value Ref Range Status   Specimen Description BLOOD LEFT ANTECUBITAL  Final   Special Requests   Final    BOTTLES DRAWN AEROBIC AND ANAEROBIC Blood Culture results may not be optimal due to an inadequate volume of blood received in culture bottles   Culture   Final    NO GROWTH 2 DAYS Performed at [facility], [address], [place], [place]    Report Status PENDING  Incomplete  SARS  Coronavirus 2 by RT PCR (hospital order, performed in [facility] hospital lab) Nasopharyngeal Nasopharyngeal Swab     Status: None   Collection Time: 10/30/20  2:38 PM   Specimen: Nasopharyngeal Swab  Result Value Ref Range Status   SARS Coronavirus 2 NEGATIVE NEGATIVE Final    Comment: (NOTE) SARS-CoV-2 target nucleic acids are NOT DETECTED.  The SARS-CoV-2 RNA is generally detectable in upper and lower respiratory specimens during the acute phase of infection. The lowest concentration of SARS-CoV-2 viral copies this assay can detect is 250 copies / mL. A negative result does not preclude SARS-CoV-2 infection and should not be used as the sole basis for treatment or other patient management decisions.  A negative result may occur with improper specimen collection / handling, submission of specimen other than nasopharyngeal swab, presence of viral mutation(s) within the areas targeted by this assay, and inadequate number of viral copies (<250 copies / mL). A negative result must be combined with clinical observations, patient history, and epidemiological information.  Fact Sheet for Patients:   BoilerBrush.com.cy  Fact Sheet for Healthcare Providers: https://pope.com/  This test is not yet approved or  cleared by the [place] FDA and has been authorized for detection and/or diagnosis of SARS-CoV-2 by FDA under an Emergency Use  Authorization (EUA).  This EUA will remain in effect (meaning this test can be used) for the duration of the COVID-19 declaration under Section 564(b)(1) of the Act, 21 U.S.C. section 360bbb-3(b)(1), unless the authorization is terminated or revoked sooner.  Performed at [facility] [address] [place], [place] [address]   Group A Strep by PCR [facility] Only)     Status: None   Collection Time: 10/30/20  2:38 PM   Specimen: Throat; Sterile Swab  Result Value Ref Range Status   Group A Strep by PCR NOT DETECTED NOT DETECTED Final    Comment: Performed at [facility], [address] [place], [place] [address]     Scheduled Meds: . amantadine  100 mg Oral BID  . cloNIDine  0.1 mg Transdermal Weekly  . heparin  5,000 Units Subcutaneous Q8H  . influenza vac split quadrivalent PF  0.5 mL Intramuscular Tomorrow-1000  . insulin aspart  0-6 Units Subcutaneous Q4H  . metoprolol tartrate  5 mg Intravenous Q6H  . polyethylene glycol  17 g Oral BID  . vancomycin variable dose per unstable renal function (pharmacist dosing)   Does not apply See admin instructions   Continuous Infusions: . ceFEPime (MAXIPIME) IV 2 g (10/31/20 1909)  . dextrose 5 % and 0.45% NaCl 150 mL/hr at 11/01/20 0859     LOS: 1 day   Lonia Blood, MD [facility] Office  [phone] Pager - Text Page per Amion  If 7PM-7AM, please contact night-coverage per Amion 11/01/2020, 9:55 AM

## 2020-11-01 NOTE — Progress Notes (Signed)
Midnight dose of metoprolol 10 mg IV held, MD notified.  HR 82 and bp 114/78.  MD verbally agreed to hold this dose and then changed order to 5mg  IV qhrs.

## 2020-11-02 ENCOUNTER — Inpatient Hospital Stay: Payer: Medicare Other

## 2020-11-02 DIAGNOSIS — A419 Sepsis, unspecified organism: Secondary | ICD-10-CM | POA: Diagnosis not present

## 2020-11-02 DIAGNOSIS — R5381 Other malaise: Secondary | ICD-10-CM | POA: Diagnosis not present

## 2020-11-02 DIAGNOSIS — E860 Dehydration: Secondary | ICD-10-CM | POA: Diagnosis not present

## 2020-11-02 DIAGNOSIS — N179 Acute kidney failure, unspecified: Secondary | ICD-10-CM | POA: Diagnosis not present

## 2020-11-02 LAB — COMPREHENSIVE METABOLIC PANEL
ALT: 26 U/L (ref 0–44)
AST: 37 U/L (ref 15–41)
Albumin: 2.2 g/dL — ABNORMAL LOW (ref 3.5–5.0)
Alkaline Phosphatase: 55 U/L (ref 38–126)
Anion gap: 12 (ref 5–15)
BUN: 19 mg/dL (ref 8–23)
CO2: 19 mmol/L — ABNORMAL LOW (ref 22–32)
Calcium: 8.4 mg/dL — ABNORMAL LOW (ref 8.9–10.3)
Chloride: 117 mmol/L — ABNORMAL HIGH (ref 98–111)
Creatinine, Ser: 1.82 mg/dL — ABNORMAL HIGH (ref 0.44–1.00)
GFR, Estimated: 31 mL/min — ABNORMAL LOW (ref >60)
Glucose, Bld: 177 mg/dL — ABNORMAL HIGH (ref 70–99)
Potassium: 2.4 mmol/L — CL (ref 3.5–5.1)
Sodium: 148 mmol/L — ABNORMAL HIGH (ref 135–145)
Total Bilirubin: 0.7 mg/dL (ref 0.3–1.2)
Total Protein: 6.3 g/dL — ABNORMAL LOW (ref 6.5–8.1)

## 2020-11-02 LAB — GLUCOSE, CAPILLARY
Glucose-Capillary: 116 mg/dL — ABNORMAL HIGH (ref 70–99)
Glucose-Capillary: 149 mg/dL — ABNORMAL HIGH (ref 70–99)
Glucose-Capillary: 156 mg/dL — ABNORMAL HIGH (ref 70–99)
Glucose-Capillary: 161 mg/dL — ABNORMAL HIGH (ref 70–99)
Glucose-Capillary: 179 mg/dL — ABNORMAL HIGH (ref 70–99)
Glucose-Capillary: 187 mg/dL — ABNORMAL HIGH (ref 70–99)
Glucose-Capillary: 188 mg/dL — ABNORMAL HIGH (ref 70–99)

## 2020-11-02 LAB — BASIC METABOLIC PANEL
Anion gap: 10 (ref 5–15)
BUN: 18 mg/dL (ref 8–23)
CO2: 21 mmol/L — ABNORMAL LOW (ref 22–32)
Calcium: 8.3 mg/dL — ABNORMAL LOW (ref 8.9–10.3)
Chloride: 116 mmol/L — ABNORMAL HIGH (ref 98–111)
Creatinine, Ser: 1.68 mg/dL — ABNORMAL HIGH (ref 0.44–1.00)
GFR, Estimated: 34 mL/min — ABNORMAL LOW (ref >60)
Glucose, Bld: 184 mg/dL — ABNORMAL HIGH (ref 70–99)
Potassium: 3.2 mmol/L — ABNORMAL LOW (ref 3.5–5.1)
Sodium: 147 mmol/L — ABNORMAL HIGH (ref 135–145)

## 2020-11-02 LAB — CBC
HCT: 31.7 % — ABNORMAL LOW (ref 36.0–46.0)
Hemoglobin: 10.5 g/dL — ABNORMAL LOW (ref 12.0–15.0)
MCH: 27.4 pg (ref 26.0–34.0)
MCHC: 33.1 g/dL (ref 30.0–36.0)
MCV: 82.8 fL (ref 80.0–100.0)
Platelets: 311 10*3/uL (ref 150–400)
RBC: 3.83 MIL/uL — ABNORMAL LOW (ref 3.87–5.11)
RDW: 16 % — ABNORMAL HIGH (ref 11.5–15.5)
WBC: 12.2 10*3/uL — ABNORMAL HIGH (ref 4.0–10.5)
nRBC: 0 % (ref 0.0–0.2)

## 2020-11-02 LAB — MAGNESIUM: Magnesium: 1.7 mg/dL (ref 1.7–2.4)

## 2020-11-02 MED ORDER — POTASSIUM CHLORIDE CRYS ER 20 MEQ PO TBCR: 40.0000 meq | EXTENDED_RELEASE_TABLET | Freq: Once | ORAL | Status: DC

## 2020-11-02 MED ORDER — ENSURE ENLIVE PO LIQD
237.0000 mL | Freq: Three times a day (TID) | ORAL | Status: DC
  Administered 2020-11-02 – 2020-11-04 (×4): 237 mL via ORAL

## 2020-11-02 MED ORDER — POTASSIUM CHLORIDE 10 MEQ/100ML IV SOLN
10.0000 meq | INTRAVENOUS | Status: AC
  Administered 2020-11-02 (×4): 10 meq via INTRAVENOUS
  Filled 2020-11-02 (×4): qty 100

## 2020-11-02 MED ORDER — SODIUM CHLORIDE 0.9 % IV SOLN
1.0000 g | INTRAVENOUS | Status: DC
  Administered 2020-11-02 – 2020-11-03 (×2): 1 g via INTRAVENOUS
  Filled 2020-11-02 (×3): qty 10

## 2020-11-02 MED ORDER — POTASSIUM CHLORIDE CRYS ER 20 MEQ PO TBCR
40.0000 meq | EXTENDED_RELEASE_TABLET | Freq: Once | ORAL | Status: AC
  Administered 2020-11-02: 40 meq via ORAL
  Filled 2020-11-02: qty 2

## 2020-11-02 MED ORDER — ADULT MULTIVITAMIN W/MINERALS CH
1.0000 | ORAL_TABLET | Freq: Every day | ORAL | Status: DC
  Administered 2020-11-03 – 2020-11-04 (×2): 1 via ORAL
  Filled 2020-11-02 (×2): qty 1

## 2020-11-02 MED ORDER — MAGNESIUM SULFATE 2 GM/50ML IV SOLN
2.0000 g | Freq: Once | INTRAVENOUS | Status: AC
  Administered 2020-11-02: 2 g via INTRAVENOUS
  Filled 2020-11-02: qty 50

## 2020-11-02 NOTE — Progress Notes (Signed)
Critical potassium of 2.4 called to Dr. Arville Care.  Orders received.

## 2020-11-02 NOTE — Progress Notes (Signed)
   10/31/20 1529  Assess: MEWS Score  Temp (!) 101 F (38.3 C)  BP (!) 166/100  Pulse Rate (!) 130  Resp 20  SpO2 100 %  O2 Device Room Air  Assess: MEWS Score  MEWS Temp 1  MEWS Systolic 0  MEWS Pulse 3  MEWS RR 0  MEWS LOC 0  MEWS Score 4  MEWS Score Color Red  Assess: if the MEWS score is Yellow or Red  Were vital signs taken at a resting state? Yes  Focused Assessment Change from prior assessment (see assessment flowsheet)  Early Detection of Sepsis Score *See Row Information* High  MEWS guidelines implemented *See Row Information* Yes  Treat  MEWS Interventions Escalated (See documentation below)  Take Vital Signs  Increase Vital Sign Frequency  Red: Q 1hr X 4 then Q 4hr X 4, if remains red, continue Q 4hrs  Escalate  MEWS: Escalate Red: discuss with charge nurse/RN and provider, consider discussing with RRT  Notify: Charge Nurse/RN  Name of Charge Nurse/RN Notified Shade Flood  Date Charge Nurse/RN Notified 10/31/20  Time Charge Nurse/RN Notified 1532  Notify: Provider  Provider Name/Title Jetty Duhamel MD  Date Provider Notified 10/31/20  Time Provider Notified 1543  Notification Type Page  Notification Reason Change in status (Also, MEWS)  Response See new orders  Date of Provider Response 10/31/20  Time of Provider Response 1553  Inserted for Lurena Joiner RN

## 2020-11-02 NOTE — Progress Notes (Signed)
Andrea [name]  KCM:[id number] DOB: 06/08/1956 DOA: 10/30/2020 PCP: [name], MD    Brief Narrative:  65 year old with a history of HTN, left MCA infarct status post craniotomy, chronic aphasia, and HLD who presented to the ED with severe failure to thrive over a 3-week period.  Family reported very poor intake with progressively worsening lethargy.  Her decline seems to have begun 10/14/2020 when she was diagnosed with strep throat.  Significant Events:  1/28 admit via [facility] ED  Antimicrobials:  Cefepime 1/28 > Vancomycin 1/28 >  DVT prophylaxis: Subcutaneous heparin  Subjective: Much more alert today. Answers short direct questions. Appears happy. No resp distress. No tremors.   Assessment & Plan:  RLL Pneumonia - Sepsis POA CXR without evidence of focal infiltrate at admission, but after hydration a RLL infiltrate has become apparent - UA abnormal in setting of severe dehydration but not suggestive of acute infection - narrow abx spectrum today w/ ongoing clinical improvement  Volume depletion - dehydration - hypernatremia -lactic acidosis Due to very limited intake - improving but not yet fully volume resuscitated -continue IV fluid support  Persistent sinus tachycardia  a consequence of her severe DH and sepsis - serum cortisol not consistent with adrenal insuff - resolved with volume resuscitation/directed therapy for sepsis  Severe Hypokalemia  Replace and follow   Hypomagnesemia Replace and follow  Elevated d-dimer  Low clinical suspicion for PE - monitor sx for now   Diet controlled DM2 - Transient hypoglycemia  Now modestly hyperglycemic with use of dextrose in IV fluid - follow trend w/ SSI   Acute renal failure Prerenal due to dehydration - baseline creatinine 0.10 January 2020 -creatinine steadily improving with volume expansion  Recent Labs  Lab 10/30/20 1227 10/31/20 0518 11/01/20 0435 11/02/20 0451 11/02/20 1448  CREATININE 3.36* 2.84* 2.15* 1.82*  1.68*     Toxic metabolic encephalopathy -severe generalized failure to thrive CT head without acute findings - clinically not suggestive of true seizure activity   Hx of CVA - chronic aphasia and R hemiparesis   HTN BP controlled   HLD Holding medical tx until intake improved  Chronic constipation  Dementia   Code Status: FULL CODE Family Communication: spoke w/ daughter at bedside  Status is: Inpatient  Remains inpatient appropriate because:Inpatient level of care appropriate due to severity of illness   Dispo: The patient is from: Home              Anticipated d/c is to: unclear              Anticipated d/c date is: 2 days              Patient currently is not medically stable to d/c.   Difficult to place patient No   Consultants:  none  Objective: Blood pressure 124/89, pulse 75, temperature 98.3 F (36.8 C), temperature source Oral, resp. rate 15, height 5\' 5"  (1.651 m), weight 55.1 kg, SpO2 100 %.  Intake/Output Summary (Last 24 hours) at 11/02/2020 1528 Last data filed at 11/02/2020 0500 Gross per 24 hour  Intake 3073.12 ml  Output 1300 ml  Net 1773.12 ml   Filed Weights   10/30/20 1225 11/02/20 0300  Weight: 60.8 kg 55.1 kg    Examination: General: No acute respiratory distress - much more alert  Lungs: Clear to auscultation B w/o wheeze - poor air movement R base  Cardiovascular: RRR - no M  Abdomen: NT/ND, soft, bs+, no mass  Extremities: No signif  edema B LE   CBC: Recent Labs  Lab 10/31/20 0518 11/01/20 0435 11/02/20 0451  WBC 15.9* 12.2* 12.2*  HGB 10.3* 11.5* 10.5*  HCT 33.1* 35.5* 31.7*  MCV 86.2 84.9 82.8  PLT 391 328 311   Basic Metabolic Panel: Recent Labs  Lab 10/30/20 1227 10/31/20 0518 11/01/20 0435 11/02/20 0451 11/02/20 0753 11/02/20 1448  NA 153*   < > 151* 148*  --  147*  K 4.1   < > 3.6 2.4*  --  3.2*  CL 117*   < > 120* 117*  --  116*  CO2 17*   < > 16* 19*  --  21*  GLUCOSE 165*   < > 243* 177*  --  184*   BUN 37*   < > 26* 19  --  18  CREATININE 3.36*   < > 2.15* 1.82*  --  1.68*  CALCIUM 10.7*   < > 8.9 8.4*  --  8.3*  MG 2.6*  --  1.9  --  1.7  --    < > = values in this interval not displayed.   GFR: Estimated Creatinine Clearance: 29.8 mL/min (A) (by C-G formula based on SCr of 1.68 mg/dL (H)).  Liver Function Tests: Recent Labs  Lab 10/30/20 1438 10/31/20 0518 11/01/20 0435 11/02/20 0451  AST 36 40 45* 37  ALT 21 22 24 26   ALKPHOS 77 65 57 55  BILITOT 1.3* 1.5* 1.0 0.7  PROT 9.0* 7.6 7.2 6.3*  ALBUMIN 3.3* 2.9* 2.6* 2.2*    Recent Labs  Lab 10/31/20 1300  AMMONIA 16    Coagulation Profile: Recent Labs  Lab 10/30/20 1438 10/31/20 0518  INR 1.1 1.2   HbA1C: Hemoglobin A1C  Date/Time Value Ref Range Status  08/31/2020 02:23 PM 6.8 (A) 4.0 - 5.6 % Final  01/30/2020 04:22 PM 6.2 (A) 4.0 - 5.6 % Final   Hgb A1c MFr Bld  Date/Time Value Ref Range Status  04/15/2019 03:29 PM 5.9 4.6 - 6.5 % Final    Comment:    Glycemic Control Guidelines for People with Diabetes:Non Diabetic:  <6%Goal of Therapy: <7%Additional Action Suggested:  >8%     CBG: Recent Labs  Lab 11/01/20 2050 11/02/20 0004 11/02/20 0428 11/02/20 0854 11/02/20 1130  GLUCAP 142* 116* 156* 187* 179*    Recent Results (from the past 240 hour(s))  Urine culture     Status: Abnormal   Collection Time: 10/30/20  2:38 PM   Specimen: Urine, Random  Result Value Ref Range Status   Specimen Description   Final    URINE, RANDOM Performed at [facility], [address] [place], [place]    Special Requests   Final    NONE Performed at [facility], [address] [place], [place]    Culture MULTIPLE SPECIES PRESENT, SUGGEST RECOLLECTION (A)  Final   Report Status 11/01/2020 FINAL  Final  Blood culture (routine x 2)     Status: None (Preliminary result)   Collection Time: 10/30/20  2:38 PM   Specimen: BLOOD  Result Value Ref Range Status   Specimen  Description BLOOD BLOOD LEFT FOREARM  Final   Special Requests   Final    BOTTLES DRAWN AEROBIC AND ANAEROBIC Blood Culture adequate volume   Culture   Final    NO GROWTH 3 DAYS Performed at [facility], [address] [place], [place]    Report Status PENDING  Incomplete  Blood  culture (routine x 2)     Status: None (Preliminary result)   Collection Time: 10/30/20  2:38 PM   Specimen: BLOOD  Result Value Ref Range Status   Specimen Description BLOOD LEFT ANTECUBITAL  Final   Special Requests   Final    BOTTLES DRAWN AEROBIC AND ANAEROBIC Blood Culture results may not be optimal due to an inadequate volume of blood received in culture bottles   Culture   Final    NO GROWTH 3 DAYS Performed at [facility], [address], [place], [place] [address]    Report Status PENDING  Incomplete  SARS Coronavirus 2 by RT PCR (hospital order, performed in [facility] hospital lab) Nasopharyngeal Nasopharyngeal Swab     Status: None   Collection Time: 10/30/20  2:38 PM   Specimen: Nasopharyngeal Swab  Result Value Ref Range Status   SARS Coronavirus 2 NEGATIVE NEGATIVE Final    Comment: (NOTE) SARS-CoV-2 target nucleic acids are NOT DETECTED.  The SARS-CoV-2 RNA is generally detectable in upper and lower respiratory specimens during the acute phase of infection. The lowest concentration of SARS-CoV-2 viral copies this assay can detect is 250 copies / mL. A negative result does not preclude SARS-CoV-2 infection and should not be used as the sole basis for treatment or other patient management decisions.  A negative result may occur with improper specimen collection / handling, submission of specimen other than nasopharyngeal swab, presence of viral mutation(s) within the areas targeted by this assay, and inadequate number of viral copies (<250 copies / mL). A negative result must be combined with clinical observations, patient history, and epidemiological  information.  Fact Sheet for Patients:   BoilerBrush.com.cy  Fact Sheet for Healthcare Providers: https://pope.com/  This test is not yet approved or  cleared by the [place] FDA and has been authorized for detection and/or diagnosis of SARS-CoV-2 by FDA under an Emergency Use Authorization (EUA).  This EUA will remain in effect (meaning this test can be used) for the duration of the COVID-19 declaration under Section 564(b)(1) of the Act, 21 U.S.C. section 360bbb-3(b)(1), unless the authorization is terminated or revoked sooner.  Performed at [facility], [address] [place], [place] [address]   Group A Strep by PCR [facility] Only)     Status: None   Collection Time: 10/30/20  2:38 PM   Specimen: Throat; Sterile Swab  Result Value Ref Range Status   Group A Strep by PCR NOT DETECTED NOT DETECTED Final    Comment: Performed at [facility], [address] [place], [place] [address]     Scheduled Meds: . amantadine  100 mg Oral BID  . cloNIDine  0.1 mg Transdermal Weekly  . heparin  5,000 Units Subcutaneous Q8H  . insulin aspart  0-6 Units Subcutaneous Q4H  . metoprolol tartrate  5 mg Intravenous Q6H  . polyethylene glycol  17 g Oral BID   Continuous Infusions: . ceFEPime (MAXIPIME) IV Stopped (11/01/20 1846)  . dextrose 5 % and 0.45% NaCl 75 mL/hr at 11/02/20 1253     LOS: 2 days   Lonia Blood, MD [facility] Office  [phone] Pager - Text Page per Amion  If 7PM-7AM, please contact night-coverage per Amion 11/02/2020, 3:28 PM

## 2020-11-02 NOTE — Progress Notes (Signed)
Modified Barium Swallow Progress Note  Patient Details  Name: Andrea [name] MRN: [id number] Date of Birth: 1955/11/03  Today's Date: 11/02/2020  Modified Barium Swallow completed.  Full report located under Chart Review in the Imaging Section.  Brief recommendations include the following:  Clinical Impression  Pt presents with severe oral phase dysphagia. Pt has moderate right sided facial weakness that impairs all oral motor function. Suspect this is baseline d/t hx of significant L CVA. Pt was attentive and able to follow 1 step basic directions such as "smile at me" but unable to follow any compensatory swallow strategies even with tactile cues. Despite total assistance, pt was not able to bring cup to her lips or suck thru a straw. I spoke with pt's daughter who states that pt's baseline diet consisted of solids, thin liquids and medicine whole. Her ability changed ~ 3 weeks ago, when she was diagnosed with strep throat. Simultaneously with diagnosis, pt began holding all food (even purees), thin liquids and required her medicine be crushed in puree.  On imaging pt's oral phase continues to be commensurate with daughter's description. Specifically, pt's oral phase deficits are c/b right anterior spillage, decreased lingual manipulation of bolus resulting decreased bolus cohesion, widespread oral residue post swallow resulting in multiple swallows per bolus of nectar thick liquids, thin liquids and puree. Pt's pharyngeal phase revealed timely swallow initiation and complete airway protection across all consistencies. Airway compromise was never compromised. At this time, I recommend dysphagia 1 diet with thin liquids via cup, medicine crushed in puree with full supervision to check for complete oral clearing. Oral care after PO intake to ensure complete oral clearing of all boluses. Pt's ability to maintain hydration and nutrition is likely to continue to be compromised.   Swallow Evaluation  Recommendations       SLP Diet Recommendations: Dysphagia 1 (Puree) solids;Thin liquid   Liquid Administration via: Cup   Medication Administration: Crushed with puree   Supervision: Full assist for feeding   Compensations: Minimize environmental distractions;Slow rate;Small sips/bites   Postural Changes: Seated upright at 90 degrees   Oral Care Recommendations: Oral care BID   Other Recommendations: Have oral suction available   Presli Fanguy B. Dreama Saa M.S., CCC-SLP, [facility] Speech-Language Pathologist Rehabilitation Services Office [phone]  Earnestine Tuohey Dreama Saa 11/02/2020,9:28 AM

## 2020-11-02 NOTE — TOC Progression Note (Signed)
Transition of Care [facility]) - Progression Note    Patient Details  Name: Trissa Molina MRN: [id number] Date of Birth: 08/03/1956  Transition of Care [facility]) CM/SW Contact  Hetty Ely, RN Phone Number: 11/02/2020, 1:35 PM  Clinical Narrative: Patient alert, however non-verbal, Daughter at bedside states she''s POA. Daughter states patient lives with her, she takes care of her with [facility] PT and Speech services. Daughter states patient will be returning to her home and there is 3 generations available to assist with care. They have all equipment needed.           Expected Discharge Plan and Services                                                 Social Determinants of Health (SDOH) Interventions    Readmission Risk Interventions No flowsheet data found.

## 2020-11-02 NOTE — Progress Notes (Signed)
Initial Nutrition Assessment  DOCUMENTATION CODES:   Not applicable  INTERVENTION:  Provide Ensure Enlive po TID, each supplement provides 350 kcal and 20 grams of protein.  Provide Magic cup TID with meals, each supplement provides 290 kcal and 9 grams of protein.  Monitor magnesium, potassium, and phosphorus daily for at least 3 days, MD to replete as needed, as pt is at risk for refeeding syndrome.  NUTRITION DIAGNOSIS:   Inadequate oral intake related to decreased appetite as evidenced by per patient/family report.  GOAL:   Patient will meet greater than or equal to 90% of their needs  MONITOR:   PO intake,Supplement acceptance,Diet advancement,Labs,Weight trends,I & O's  REASON FOR ASSESSMENT:   Malnutrition Screening Tool    ASSESSMENT:   65 year old female with PMHx of HTN, left MCA infarct s/p craniotomy, chronic aphasia, HLD admitted with FTT, right lower lobe PNA, volume depletion/dehydration, acute renal failure.   1/31 s/p MBS: diet advanced to dysphagia 1 with thin liquids  Met with patient at bedside. She was able to provide short one-word answers such as yes and no but unable to provide full history. Spoke with patient's daughter over the phone who was actually here all day with patient but RD had just missed. Daughter reports patient was diagnosed with strep on 1/12 and since then has had decreased appetite and intake. She started taking longer to chew and was holding food in her mouth. She was previously eating solid foods such as chicken, fish, mashed potatoes. She also started taking only small sips vs previously being able to drink a whole cup of fluid. Daughter reports patient previously had a PEG tube in May 2018 for approximately 6 months after her stroke. She was able to have PEG removed as PO intake improved and patient progressed from puree to regular texture diet. Plan is to provide patient with oral nutrition supplements to help improve intake.  Patient's daughter reports patient is allergic to citrus including citrus-flavored products such as orange sherbet. Updated allergy in system.  Patient's daughter reports patient has likely lost weight but difficult to obtain weights due to wheelchair. Per review of chart patient was documented to be 54.1 kg on 10/17/2019, 57.4 kg on 01/30/2020, 62.4 kg on 08/31/2020, and is currently documented to be 55.1 kg (121.4 lbs). If weights in chart are accurate patient has lost 7.3 kg (11.7% body weight) over the past 2 months, which would be significant for time frame. However, daughter reports she is unsure if weights are accurate.  Medications reviewed and include: Novolog 0-6 units Q4hrs, Miralax, 17 grams BID, potassium chloride 40 mEq once today, ceftriaxone, D5-1/2NS at 75 mL/hr.  Labs reviewed: CBG 179-187, Sodium 147, Potassium 3.2, Chloride 116, CO2 21, Creatinine 1.68.   Patient is at risk for malnutrition. Some degree of muscle depletion expected due to wheelchair status and unsure if weight trend is accurate.  NUTRITION - FOCUSED PHYSICAL EXAM:  Flowsheet Row Most Recent Value  Orbital Region No depletion  Upper Arm Region Mild depletion  Thoracic and Lumbar Region No depletion  Buccal Region No depletion  Temple Region No depletion  Clavicle Bone Region Moderate depletion  Clavicle and Acromion Bone Region Mild depletion  Scapular Bone Region Unable to assess  Dorsal Hand Moderate depletion  Patellar Region Severe depletion  Anterior Thigh Region Moderate depletion  Posterior Calf Region Severe depletion  Edema (RD Assessment) None  Hair Reviewed  Eyes Reviewed  Mouth Reviewed  Skin Reviewed  Nails Reviewed  Diet Order:   Diet Order            DIET - DYS 1 Room service appropriate? Yes; Fluid consistency: Thin  Diet effective now                EDUCATION NEEDS:   No education needs have been identified at this time  Skin:  Skin Assessment: Reviewed RN  Assessment  Last BM:  Unknown  Height:   Ht Readings from Last 1 Encounters:  10/30/20 _0  (1.651 m)   Weight:   Wt Readings from Last 1 Encounters:  11/02/20 55.1 kg   BMI:  Body mass index is 20.2 kg/m.  Estimated Nutritional Needs:   Kcal:  1500-1700  Protein:  75-85 grams  Fluid:  1.5-1.7 L/day  Jacklynn Barnacle, MS, RD, LDN Pager number available on Amion

## 2020-11-03 DIAGNOSIS — E860 Dehydration: Secondary | ICD-10-CM | POA: Diagnosis not present

## 2020-11-03 DIAGNOSIS — R5381 Other malaise: Secondary | ICD-10-CM | POA: Diagnosis not present

## 2020-11-03 DIAGNOSIS — N179 Acute kidney failure, unspecified: Secondary | ICD-10-CM | POA: Diagnosis not present

## 2020-11-03 DIAGNOSIS — A419 Sepsis, unspecified organism: Secondary | ICD-10-CM | POA: Diagnosis not present

## 2020-11-03 LAB — COMPREHENSIVE METABOLIC PANEL
ALT: 25 U/L (ref 0–44)
AST: 25 U/L (ref 15–41)
Albumin: 2.1 g/dL — ABNORMAL LOW (ref 3.5–5.0)
Alkaline Phosphatase: 55 U/L (ref 38–126)
Anion gap: 11 (ref 5–15)
BUN: 17 mg/dL (ref 8–23)
CO2: 20 mmol/L — ABNORMAL LOW (ref 22–32)
Calcium: 8.3 mg/dL — ABNORMAL LOW (ref 8.9–10.3)
Chloride: 116 mmol/L — ABNORMAL HIGH (ref 98–111)
Creatinine, Ser: 1.6 mg/dL — ABNORMAL HIGH (ref 0.44–1.00)
GFR, Estimated: 36 mL/min — ABNORMAL LOW (ref >60)
Glucose, Bld: 159 mg/dL — ABNORMAL HIGH (ref 70–99)
Potassium: 3.5 mmol/L (ref 3.5–5.1)
Sodium: 147 mmol/L — ABNORMAL HIGH (ref 135–145)
Total Bilirubin: 0.5 mg/dL (ref 0.3–1.2)
Total Protein: 5.8 g/dL — ABNORMAL LOW (ref 6.5–8.1)

## 2020-11-03 LAB — CBC
HCT: 31 % — ABNORMAL LOW (ref 36.0–46.0)
Hemoglobin: 10.1 g/dL — ABNORMAL LOW (ref 12.0–15.0)
MCH: 27.2 pg (ref 26.0–34.0)
MCHC: 32.6 g/dL (ref 30.0–36.0)
MCV: 83.3 fL (ref 80.0–100.0)
Platelets: 277 10*3/uL (ref 150–400)
RBC: 3.72 MIL/uL — ABNORMAL LOW (ref 3.87–5.11)
RDW: 16.1 % — ABNORMAL HIGH (ref 11.5–15.5)
WBC: 6.9 10*3/uL (ref 4.0–10.5)
nRBC: 0 % (ref 0.0–0.2)

## 2020-11-03 LAB — GLUCOSE, CAPILLARY
Glucose-Capillary: 150 mg/dL — ABNORMAL HIGH (ref 70–99)
Glucose-Capillary: 161 mg/dL — ABNORMAL HIGH (ref 70–99)
Glucose-Capillary: 225 mg/dL — ABNORMAL HIGH (ref 70–99)
Glucose-Capillary: 113 mg/dL — ABNORMAL HIGH (ref 70–99)
Glucose-Capillary: 173 mg/dL — ABNORMAL HIGH (ref 70–99)

## 2020-11-03 LAB — MAGNESIUM: Magnesium: 2.1 mg/dL (ref 1.7–2.4)

## 2020-11-03 MED ORDER — METOPROLOL TARTRATE 25 MG PO TABS
12.5000 mg | ORAL_TABLET | Freq: Two times a day (BID) | ORAL | Status: DC
  Administered 2020-11-03 – 2020-11-04 (×2): 12.5 mg via ORAL
  Filled 2020-11-03 (×2): qty 1

## 2020-11-03 MED ORDER — AMLODIPINE BESYLATE 10 MG PO TABS
10.0000 mg | ORAL_TABLET | Freq: Every day | ORAL | Status: DC
Start: 2020-11-03 — End: 2020-11-04
  Administered 2020-11-03 – 2020-11-04 (×2): 10 mg via ORAL
  Filled 2020-11-03 (×2): qty 1

## 2020-11-03 MED ORDER — INSULIN ASPART 100 UNIT/ML ~~LOC~~ SOLN
0.0000 [IU] | Freq: Three times a day (TID) | SUBCUTANEOUS | Status: DC
  Administered 2020-11-03: 2 [IU] via SUBCUTANEOUS
  Administered 2020-11-04: 1 [IU] via SUBCUTANEOUS
  Filled 2020-11-03: qty 1

## 2020-11-03 MED ORDER — INSULIN ASPART 100 UNIT/ML ~~LOC~~ SOLN: 0.0000 [IU] | Freq: Every day | SUBCUTANEOUS | Status: DC

## 2020-11-03 NOTE — Care Management Important Message (Signed)
Important Message  Patient Details  Name: Andrea [name] MRN: [id number] Date of Birth: January 22, 1956   Medicare Important Message Given:  N/A - LOS <3 / Initial given by admissions  Initial Medicare IM reviewed with [name], daughter, at [phone] by Jennye Moccasin, Patient Access Associate on 10/23/2020 at 9:05am.   Johnell Comings 11/03/2020, 9:02 AM

## 2020-11-03 NOTE — Progress Notes (Signed)
Andrea [name]  BMS:[id number] DOB: 1955/12/09 DOA: 10/30/2020 PCP: [name], MD    Brief Narrative:  [phone] with a history of HTN, left MCA infarct status post craniotomy, chronic aphasia, and HLD who presented to the ED with severe failure to thrive over a 3-week period.  Family reported very poor intake with progressively worsening lethargy.  Her decline seems to have begun 10/14/2020 when she was diagnosed with strep throat.  Significant Events:  1/28 admit via [facility] ED  Antimicrobials:  Cefepime 1/28 > Vancomycin 1/28 >  DVT prophylaxis: Subcutaneous heparin  Subjective: Afebrile. Vital signs stable. Saturations 100% on room air. Continues to improve in general. Very alert today. Interacts with examiner. Speaks single words in response to questions appropriately. Appears comfortable.  Assessment & Plan:  RLL Pneumonia - Sepsis POA CXR without evidence of focal infiltrate at admission, but after hydration a RLL infiltrate has become apparent - UA abnormal in setting of severe dehydration but not suggestive of acute infection - narrowed abx spectrum   Volume depletion - dehydration - hypernatremia -lactic acidosis Due to very limited intake - improving but not yet fully volume resuscitated -continue IV fluid support at lower rate   Persistent sinus tachycardia  a consequence of her severe DH and sepsis - serum cortisol not consistent with adrenal insuff - resolved with volume resuscitation/directed therapy for sepsis  Severe Hypokalemia  Much improved with ongoing supplementation - cont to supplement to goal of 4.0   Hypomagnesemia Corrected with supplementation  Elevated d-dimer  Low clinical suspicion for PE - monitor sx for now   Diet controlled DM2 - Transient hypoglycemia  CBG controlled- follow trend w/ SSI   Acute renal failure Prerenal due to dehydration - baseline creatinine 0.10 January 2020 -creatinine steadily improving with volume expansion  Recent Labs  Lab  10/31/20 0518 11/01/20 0435 11/02/20 0451 11/02/20 1448 11/03/20 0605  CREATININE 2.84* 2.15* 1.82* 1.68* 1.60*     Toxic metabolic encephalopathy -severe generalized failure to thrive CT head without acute findings - clinically not suggestive of true seizure activity   Hx of CVA - chronic aphasia and R hemiparesis   HTN BP controlled   HLD Holding medical tx until intake improved  Chronic constipation  Dementia   Code Status: FULL CODE Family Communication: spoke w/ daughter at bedside  Status is: Inpatient  Remains inpatient appropriate because:Inpatient level of care appropriate due to severity of illness   Dispo: The patient is from: Home              Anticipated d/c is to: unclear              Anticipated d/c date is: 2 days              Patient currently is not medically stable to d/c.   Difficult to place patient No   Consultants:  none  Objective: Blood pressure 132/80, pulse 72, temperature (!) 97.5 F (36.4 C), temperature source Oral, resp. rate 15, height 5\' 5"  (1.651 m), weight 55.1 kg, SpO2 100 %.  Intake/Output Summary (Last 24 hours) at 11/03/2020 0802 Last data filed at 11/03/2020 0416 Gross per 24 hour  Intake 1992.02 ml  Output 1125 ml  Net 867.02 ml   Filed Weights   10/30/20 1225 11/02/20 0300  Weight: 60.8 kg 55.1 kg    Examination: General: No acute respiratory distress - lalert and pleasant  Lungs: crackles R base - no wheeze  Cardiovascular: RRR w/o M  Abdomen:  NT/ND, soft, bs+, no mass  Extremities: No signif edema B lower extremities  CBC: Recent Labs  Lab 11/01/20 0435 11/02/20 0451 11/03/20 0605  WBC 12.2* 12.2* 6.9  HGB 11.5* 10.5* 10.1*  HCT 35.5* 31.7* 31.0*  MCV 84.9 82.8 83.3  PLT 328 311 277   Basic Metabolic Panel: Recent Labs  Lab 11/01/20 0435 11/02/20 0451 11/02/20 0753 11/02/20 1448 11/03/20 0605  NA 151* 148*  --  147* 147*  K 3.6 2.4*  --  3.2* 3.5  CL 120* 117*  --  116* 116*  CO2 16* 19*   --  21* 20*  GLUCOSE 243* 177*  --  184* 159*  BUN 26* 19  --  18 17  CREATININE 2.15* 1.82*  --  1.68* 1.60*  CALCIUM 8.9 8.4*  --  8.3* 8.3*  MG 1.9  --  1.7  --  2.1   GFR: Estimated Creatinine Clearance: 31.3 mL/min (A) (by C-G formula based on SCr of 1.6 mg/dL (H)).  Liver Function Tests: Recent Labs  Lab 10/31/20 0518 11/01/20 0435 11/02/20 0451 11/03/20 0605  AST 40 45* 37 25  ALT 22 24 26 25   ALKPHOS 65 57 55 55  BILITOT 1.5* 1.0 0.7 0.5  PROT 7.6 7.2 6.3* 5.8*  ALBUMIN 2.9* 2.6* 2.2* 2.1*    Recent Labs  Lab 10/31/20 1300  AMMONIA 16    Coagulation Profile: Recent Labs  Lab 10/30/20 1438 10/31/20 0518  INR 1.1 1.2   HbA1C: Hemoglobin A1C  Date/Time Value Ref Range Status  08/31/2020 02:23 PM 6.8 (A) 4.0 - 5.6 % Final  01/30/2020 04:22 PM 6.2 (A) 4.0 - 5.6 % Final   Hgb A1c MFr Bld  Date/Time Value Ref Range Status  04/15/2019 03:29 PM 5.9 4.6 - 6.5 % Final    Comment:    Glycemic Control Guidelines for People with Diabetes:Non Diabetic:  <6%Goal of Therapy: <7%Additional Action Suggested:  >8%     CBG: Recent Labs  Lab 11/02/20 1130 11/02/20 1728 11/02/20 2002 11/02/20 2332 11/03/20 0325  GLUCAP 179* 161* 188* 149* 150*    Recent Results (from the past 240 hour(s))  Urine culture     Status: Abnormal   Collection Time: 10/30/20  2:38 PM   Specimen: Urine, Random  Result Value Ref Range Status   Specimen Description   Final    URINE, RANDOM Performed at [facility], [address] [place], [place]    Special Requests   Final    NONE Performed at [facility], [address] [place], [place]    Culture MULTIPLE SPECIES PRESENT, SUGGEST RECOLLECTION (A)  Final   Report Status 11/01/2020 FINAL  Final  Blood culture (routine x 2)     Status: None (Preliminary result)   Collection Time: 10/30/20  2:38 PM   Specimen: BLOOD  Result Value Ref Range Status   Specimen Description BLOOD BLOOD LEFT  FOREARM  Final   Special Requests   Final    BOTTLES DRAWN AEROBIC AND ANAEROBIC Blood Culture adequate volume   Culture   Final    NO GROWTH 4 DAYS Performed at [facility], [address] [place], [place]    Report Status PENDING  Incomplete  Blood culture (routine x 2)     Status: None (Preliminary result)   Collection Time: 10/30/20  2:38 PM   Specimen: BLOOD  Result Value Ref Range Status   Specimen Description BLOOD LEFT ANTECUBITAL  Final  Special Requests   Final    BOTTLES DRAWN AEROBIC AND ANAEROBIC Blood Culture results may not be optimal due to an inadequate volume of blood received in culture bottles   Culture   Final    NO GROWTH 4 DAYS Performed at [facility], [address], [place], [place] [address]    Report Status PENDING  Incomplete  SARS Coronavirus 2 by RT PCR (hospital order, performed in [facility] hospital lab) Nasopharyngeal Nasopharyngeal Swab     Status: None   Collection Time: 10/30/20  2:38 PM   Specimen: Nasopharyngeal Swab  Result Value Ref Range Status   SARS Coronavirus 2 NEGATIVE NEGATIVE Final    Comment: (NOTE) SARS-CoV-2 target nucleic acids are NOT DETECTED.  The SARS-CoV-2 RNA is generally detectable in upper and lower respiratory specimens during the acute phase of infection. The lowest concentration of SARS-CoV-2 viral copies this assay can detect is 250 copies / mL. A negative result does not preclude SARS-CoV-2 infection and should not be used as the sole basis for treatment or other patient management decisions.  A negative result may occur with improper specimen collection / handling, submission of specimen other than nasopharyngeal swab, presence of viral mutation(s) within the areas targeted by this assay, and inadequate number of viral copies (<250 copies / mL). A negative result must be combined with clinical observations, patient history, and epidemiological information.  Fact Sheet for  Patients:   BoilerBrush.com.cy  Fact Sheet for Healthcare Providers: https://pope.com/  This test is not yet approved or  cleared by the [place] FDA and has been authorized for detection and/or diagnosis of SARS-CoV-2 by FDA under an Emergency Use Authorization (EUA).  This EUA will remain in effect (meaning this test can be used) for the duration of the COVID-19 declaration under Section 564(b)(1) of the Act, 21 U.S.C. section 360bbb-3(b)(1), unless the authorization is terminated or revoked sooner.  Performed at [facility], [address] [place], [place] [address]   Group A Strep by PCR [facility] Only)     Status: None   Collection Time: 10/30/20  2:38 PM   Specimen: Throat; Sterile Swab  Result Value Ref Range Status   Group A Strep by PCR NOT DETECTED NOT DETECTED Final    Comment: Performed at [facility], [address] [place], [place] [address]     Scheduled Meds: . amantadine  100 mg Oral BID  . cloNIDine  0.1 mg Transdermal Weekly  . feeding supplement  237 mL Oral TID BM  . heparin  5,000 Units Subcutaneous Q8H  . insulin aspart  0-6 Units Subcutaneous Q4H  . metoprolol tartrate  5 mg Intravenous Q6H  . multivitamin with minerals  1 tablet Oral Daily  . polyethylene glycol  17 g Oral BID   Continuous Infusions: . cefTRIAXone (ROCEPHIN)  IV 1 g (11/02/20 1758)  . dextrose 5 % and 0.45% NaCl 75 mL/hr at 11/02/20 1253     LOS: 3 days   Lonia Blood, MD [facility] Office  [phone] Pager - Text Page per Amion  If 7PM-7AM, please contact night-coverage per Amion 11/03/2020, 8:02 AM

## 2020-11-04 ENCOUNTER — Telehealth: Payer: Self-pay

## 2020-11-04 DIAGNOSIS — R6520 Severe sepsis without septic shock: Secondary | ICD-10-CM | POA: Diagnosis not present

## 2020-11-04 DIAGNOSIS — N179 Acute kidney failure, unspecified: Secondary | ICD-10-CM | POA: Diagnosis not present

## 2020-11-04 DIAGNOSIS — A419 Sepsis, unspecified organism: Secondary | ICD-10-CM | POA: Diagnosis not present

## 2020-11-04 LAB — BASIC METABOLIC PANEL
Anion gap: 12 (ref 5–15)
BUN: 14 mg/dL (ref 8–23)
CO2: 17 mmol/L — ABNORMAL LOW (ref 22–32)
Calcium: 8.8 mg/dL — ABNORMAL LOW (ref 8.9–10.3)
Chloride: 116 mmol/L — ABNORMAL HIGH (ref 98–111)
Creatinine, Ser: 1.42 mg/dL — ABNORMAL HIGH (ref 0.44–1.00)
GFR, Estimated: 42 mL/min — ABNORMAL LOW (ref >60)
Glucose, Bld: 212 mg/dL — ABNORMAL HIGH (ref 70–99)
Potassium: 3.4 mmol/L — ABNORMAL LOW (ref 3.5–5.1)
Sodium: 145 mmol/L (ref 135–145)

## 2020-11-04 LAB — CBC
HCT: 32.8 % — ABNORMAL LOW (ref 36.0–46.0)
Hemoglobin: 10.5 g/dL — ABNORMAL LOW (ref 12.0–15.0)
MCH: 27.3 pg (ref 26.0–34.0)
MCHC: 32 g/dL (ref 30.0–36.0)
MCV: 85.2 fL (ref 80.0–100.0)
Platelets: 257 10*3/uL (ref 150–400)
RBC: 3.85 MIL/uL — ABNORMAL LOW (ref 3.87–5.11)
RDW: 16.7 % — ABNORMAL HIGH (ref 11.5–15.5)
WBC: 9 10*3/uL (ref 4.0–10.5)
nRBC: 0 % (ref 0.0–0.2)

## 2020-11-04 LAB — CULTURE, BLOOD (ROUTINE X 2)
Culture: NO GROWTH
Culture: NO GROWTH
Special Requests: ADEQUATE

## 2020-11-04 LAB — GLUCOSE, CAPILLARY
Glucose-Capillary: 180 mg/dL — ABNORMAL HIGH (ref 70–99)
Glucose-Capillary: 163 mg/dL — ABNORMAL HIGH (ref 70–99)

## 2020-11-04 LAB — MAGNESIUM: Magnesium: 1.9 mg/dL (ref 1.7–2.4)

## 2020-11-04 MED ORDER — HYDRALAZINE HCL 50 MG PO TABS: 50.0000 mg | ORAL_TABLET | Freq: Three times a day (TID) | ORAL | 2 refills | Status: DC

## 2020-11-04 MED ORDER — ENSURE ENLIVE PO LIQD: 237.0000 mL | Freq: Three times a day (TID) | ORAL | 12 refills | Status: DC

## 2020-11-04 MED ORDER — POTASSIUM CHLORIDE 20 MEQ PO PACK
40.0000 meq | PACK | Freq: Once | ORAL | Status: AC
  Administered 2020-11-04: 40 meq via ORAL
  Filled 2020-11-04: qty 2

## 2020-11-04 MED ORDER — POLYETHYLENE GLYCOL 3350 17 G PO PACK: 17.0000 g | PACK | Freq: Two times a day (BID) | ORAL | 0 refills | Status: DC | PRN

## 2020-11-04 MED ORDER — METOPROLOL TARTRATE 25 MG PO TABS: 25.0000 mg | ORAL_TABLET | Freq: Two times a day (BID) | ORAL | 2 refills | Status: DC

## 2020-11-04 NOTE — Plan of Care (Signed)
  Problem: SLP Dysphagia Goals Goal: Misc Dysphagia Goal Outcome: Adequate for Discharge   Problem: Education: Goal: Knowledge of General Education information will improve Description: Including pain rating scale, medication(s)/side effects and non-pharmacologic comfort measures Outcome: Adequate for Discharge   Problem: Health Behavior/Discharge Planning: Goal: Ability to manage health-related needs will improve Outcome: Adequate for Discharge   Problem: Clinical Measurements: Goal: Ability to maintain clinical measurements within normal limits will improve Outcome: Adequate for Discharge Goal: Will remain free from infection Outcome: Adequate for Discharge Goal: Diagnostic test results will improve Outcome: Adequate for Discharge Goal: Respiratory complications will improve Outcome: Adequate for Discharge Goal: Cardiovascular complication will be avoided Outcome: Adequate for Discharge   Problem: Activity: Goal: Risk for activity intolerance will decrease Outcome: Adequate for Discharge   Problem: Nutrition: Goal: Adequate nutrition will be maintained Outcome: Adequate for Discharge   Problem: Coping: Goal: Level of anxiety will decrease Outcome: Adequate for Discharge   Problem: Elimination: Goal: Will not experience complications related to bowel motility Outcome: Adequate for Discharge Goal: Will not experience complications related to urinary retention Outcome: Adequate for Discharge   Problem: Pain Managment: Goal: General experience of comfort will improve Outcome: Adequate for Discharge   Problem: Safety: Goal: Ability to remain free from injury will improve Outcome: Adequate for Discharge   Problem: Skin Integrity: Goal: Risk for impaired skin integrity will decrease Outcome: Adequate for Discharge   Problem: Inadequate Intake (NI-2.1) Goal: Food and/or nutrient delivery Description: Individualized approach for food/nutrient provision. Outcome:  Adequate for Discharge

## 2020-11-04 NOTE — Discharge Summary (Signed)
Physician Discharge Summary   Andrea [name]  female DOB: 09/08/1956  ZOX:[id number]  PCP: Lynnda Childody, Jessica R, MD  Admit date: 10/30/2020 Discharge date: 11/04/2020  Admitted From: home Disposition:  Home Daughter updated at bedside prior to discharge.  Home Health: notified of TOC of HH needs. CODE STATUS: Full code  Discharge Instructions    Discharge instructions   Complete by: As directed    I have increased your blood pressure medication, hydralazine to 50 mg 3 times a day, and Lopressor 25 mg 2 times a day.  You have completed 5 days of IV antibiotics for pneumonia.   Dr. Darlin Priestlyina Davey [facility]- -       Hospital Course:  For full details, please see H&P, progress notes, consult notes and ancillary notes.  Briefly,  Andrea CreteCalma Santerre is a 65yo with a history of HTN, left MCA infarct status post craniotomy, chronic aphasia, and HLD who presented to the ED with severe failure to thrive over a 3-week period.  Family reported very poor intake with progressively worsening lethargy.  Her decline seems to have begun 10/14/2020 when she was diagnosed with strep throat.  RLL Pneumonia - Sepsis POA CXR without evidence of focal infiltrate at admission, but after hydration a RLL infiltrate has become apparent - UA abnormal in setting of severe dehydration but not suggestive of acute infection.  Pt received 3 days of cefepime followed by 2 days of ceftriaxone.  Volume depletion - dehydration - hypernatremia -lactic acidosis Due to very limited intake.  Pt received gentle hydration with improvement.  Persistent sinus tachycardia  a consequence of her severe dehydration and sepsis - serum cortisol not consistent with adrenal insuff.   Resolved with volume resuscitation/directed therapy for sepsis  Severe Hypokalemia  Much improved with ongoing supplementation.    Hypomagnesemia Corrected with supplementation  Elevated d-dimer  Low clinical suspicion for PE.  Diet controlled DM2 - Transient  hypoglycemia  Received SSI while inpatient  Acute renal failure Prerenal due to dehydration  Cr 3.36 on presentation.  baseline creatinine 0.10 January 2020.  Cr improved with IVF, was 1.42 on the day of discharge.  Toxic metabolic encephalopathy -severe generalized failure to thrive CT head without acute findings.  clinically not suggestive of true seizure activity   Hx of CVA  chronic aphasia and R hemiparesis   HTN BP trending up.  Home hydralazine increased to 50 mg BID and home Lopressor increased to 25 mg BID.  Continued home amlodipine.  HLD Home statin held during hospitalization, and resumed at discharge.  Chronic constipation Miralax BID PRN  Dementia  Dysphagia Dys 1 with thin liquid and crushed pills during current hospitalization.  SLP asked to see pt again prior to discharge and gave daughter instruction on diet.   Discharge Diagnoses:  Principal Problem:   Sepsis (HCC) Active Problems:   History of stroke   Aphasia as late effect of stroke   Right sided weakness   Essential hypertension   HLD (hyperlipidemia)   Dehydration   AKI (acute kidney injury) (HCC)   Debility   SIRS (systemic inflammatory response syndrome) (HCC)    Discharge Instructions:  Allergies as of 11/04/2020      Reactions   Citric Acid Hives   Citrus Hives      Medication List    STOP taking these medications   senna 8.6 MG tablet Commonly known as: SENOKOT     TAKE these medications   amantadine 100 MG capsule Commonly known as: SYMMETREL Take  1 capsule (100 mg total) by mouth 2 (two) times daily.   amLODipine 10 MG tablet Commonly known as: NORVASC Take 1 tablet (10 mg total) by mouth daily.   aspirin EC 81 MG tablet Take 81 mg by mouth daily.   CENTRUM SILVER PO Take 1 tablet by mouth daily.   Cholecalciferol 25 MCG (1000 UT) tablet Take 2,000 Units by mouth daily.   feeding supplement Liqd Take 237 mLs by mouth 3 (three) times daily between meals.    hydrALAZINE 50 MG tablet Commonly known as: APRESOLINE Take 1 tablet (50 mg total) by mouth 3 (three) times daily. What changed:   medication strength  how much to take  when to take this  reasons to take this   metoprolol tartrate 25 MG tablet Commonly known as: LOPRESSOR Take 1 tablet (25 mg total) by mouth 2 (two) times daily. What changed: how much to take   OVER THE COUNTER MEDICATION OMEGA 3 FISH OIL. 2000 MG. 1 DAILY   polyethylene glycol 17 g packet Commonly known as: MIRALAX / GLYCOLAX Take 17 g by mouth 2 (two) times daily as needed.   pravastatin 80 MG tablet Commonly known as: PRAVACHOL Take 1 tablet (80 mg total) by mouth daily.   PROBIOTIC-10 PO Take by mouth. 1 tablet daily   vitamin C 500 MG tablet Commonly known as: ASCORBIC ACID Take 500 mg by mouth daily.        Follow-up Information    [name], MD. Schedule an appointment as soon as possible for a visit in 1 week(s).   Specialty: Family Medicine Contact information: [address] [place] [address] [phone]               Allergies  Allergen Reactions  . Citric Acid Hives  . Citrus Hives     The results of significant diagnostics from this hospitalization (including imaging, microbiology, ancillary and laboratory) are listed below for reference.   Consultations:   Procedures/Studies: CT Head Wo Contrast  Result Date: 10/30/2020 CLINICAL DATA:  Mental status change. EXAM: CT HEAD WITHOUT CONTRAST TECHNIQUE: Contiguous axial images were obtained from the base of the skull through the vertex without intravenous contrast. COMPARISON:  None. FINDINGS: Brain: Encephalomalacia throughout most of the left MCA territory compatible with chronic infarct. This extends into the basal ganglia. Compensatory enlargement left lateral ventricle. Negative for hydrocephalus. Mild white matter changes in the right hemisphere Negative for acute infarct, hemorrhage, mass Vascular:  Negative for hyperdense vessel Skull: Left-sided craniotomy. Cranioplasty flap is not well attached to the calvarium and is displaced medially particularly along the inferior and posterior margins of the craniotomy. Bony resorption of the craniotomy flap. Sinuses/Orbits: Air-fluid level in the right sphenoid sinus. Remaining sinuses clear. Other: None IMPRESSION: No prior study. Chronic left MCA infarct. No acute infarct or hemorrhage Left craniotomy. Cranioplasty flap is displaced medially with loss of attachment to the calvarium. Electronically Signed   By: Marlan Palau M.D.   On: 10/30/2020 16:07   DG Chest Port 1 View  Result Date: 11/01/2020 CLINICAL DATA:  Weakness, sepsis, acute kidney injury, history stroke, former smoker EXAM: PORTABLE CHEST 1 VIEW COMPARISON:  Portable exam 0710 hours compared to 10/30/2020 FINDINGS: Normal heart size, mediastinal contours, and pulmonary vascularity. Atherosclerotic calcification aorta. Mild opacity at RIGHT lung base which could represent atelectasis or infiltrate. Remaining lungs clear. No pleural effusion or pneumothorax. Bones demineralized. IMPRESSION: New mild opacity at RIGHT lung base question atelectasis versus infiltrate.  Electronically Signed   By: Ulyses Southward M.D.   On: 11/01/2020 09:17   DG Chest Port 1 View  Result Date: 10/30/2020 CLINICAL DATA:  Sepsis. EXAM: PORTABLE CHEST 1 VIEW COMPARISON:  None. FINDINGS: The patient is rotated to the right on today's radiograph, reducing diagnostic sensitivity and specificity. Heart size within normal limits. The lungs appear clear. No blunting of the costophrenic angles. A pulmonary cause for sepsis is not identified. IMPRESSION: 1. No active cardiopulmonary disease is radiographically apparent. Electronically Signed   By: Gaylyn Rong M.D.   On: 10/30/2020 15:02   DG Swallowing Func-Speech Pathology  Result Date: 11/02/2020 Objective Swallowing Evaluation: Type of Study: MBS-Modified Barium  Swallow Study  Patient Details Name: Meagon Duskin MRN: [id number] Date of Birth: December 18, 1955 Today's Date: 11/02/2020 Time: SLP Start Time (ACUTE ONLY): 0820 -SLP Stop Time (ACUTE ONLY): 0850 SLP Time Calculation (min) (ACUTE ONLY): 30 min Past Medical History: Past Medical History: Diagnosis Date . Stroke [facility])  Past Surgical History: Past Surgical History: Procedure Laterality Date . BREAST BIOPSY Left  . CESAREAN SECTION    4  . CRANIOPLASTY Left 08/08/2017 . CRANIOTOMY FOR HEMISPHERECTOMY TOTAL / PARTIAL Left 02/16/2017 . PATELLA FRACTURE SURGERY Right  . PERCUTANEOUS ENDOSCOPIC GASTROSTOMY (PEG) REMOVAL   . TUBAL LIGATION   HPI: Patient is a 65 y.o. female with PMHx of HTN, L MCA infarct ~3 years prior s/p L craniotomy and aphasia who presents to the ED d/t poor PO intake and weakness for last ~3 weeks. Patient with dx of sepsis with unknown source. Per daughter report, patient had dysphagia and swallowing test via MBSS about 3 years ago following her stroke and "worked her way up" to a regular diet. She had no food/liquid consistency restrictions at home and still consumed a regular/thin liquid diet up until ~3 weeks ago when she started to demonstrated difficulty/refused PO intake. Per today's RN report, [facility] swallow screen has not been completed d/t patient's fluctuating status and concerns for choking/aspiration with PO intake. RN also reported that RN last night provided patient with crusehd meds in ice cream without noted difficulty, otherwise she has been NPO. CT 1/28 stated no acute infarct or hemmorrhage. CXR 1/28 stated no acute cardiopulmonary disease. SLP to complete BSE today.  Subjective: pt pleasant, alert, unable to follow compensatory swallow strategies Assessment / Plan / Recommendation CHL IP CLINICAL IMPRESSIONS 11/02/2020 Clinical Impression Pt presents with severe oral phase dysphagia. Pt has moderate right sided facial weakness that impairs all oral motor function. Suspect this is baseline d/t  hx of significant L CVA. Pt was attentive and able to follow 1 step basic directions such as "smile at me" but unable to follow any compensatory swallow strategies even with tactile cues. Despite total assistance, pt was not able to bring cup to her lips or suck thru a straw. I spoke with pt's daughter who states that pt's baseline diet consisted of solids, thin liquids and medicine whole. Her ability changed ~ 3 weeks ago, when she was diagnosed with strep throat. Simultaneously with diagnosis, pt began holding all food (even purees), thin liquids and required her medicine be crushed in puree.  On imaging pt's oral phase continues to be commensurate with daughter's description. Specifically, pt's oral phase deficits are c/b right anterior spillage, decreased lingual manipulation of bolus resulting decreased bolus cohesion, widespread oral residue post swallow resulting in multiple swallows per bolus of nectar thick liquids, thin liquids and puree. Pt's pharyngeal phase revealed timely swallow initiation and complete  airway protection across all consistencies. Airway compromise was never compromised. At this time, I recommend dysphagia 1 diet with thin liquids via cup, medicine crushed in puree with full supervision to check for complete oral clearing. Oral care after PO intake to ensure complete oral clearing of all boluses. Pt's ability to maintain hydration and nutrition is likely to continue to be compromised. SLP Visit Diagnosis Dysphagia, oral phase (R13.11) Attention and concentration deficit following -- Frontal lobe and executive function deficit following -- Impact on safety and function Risk for inadequate nutrition/hydration   CHL IP TREATMENT RECOMMENDATION 11/02/2020 Treatment Recommendations No treatment recommended at this time   Prognosis 11/02/2020 Prognosis for Safe Diet Advancement Guarded Barriers to Reach Goals Cognitive deficits;Language deficits;Severity of deficits;Behavior Barriers/Prognosis  Comment -- CHL IP DIET RECOMMENDATION 11/02/2020 SLP Diet Recommendations Dysphagia 1 (Puree) solids;Thin liquid Liquid Administration via Cup Medication Administration Crushed with puree Compensations Minimize environmental distractions;Slow rate;Small sips/bites Postural Changes Seated upright at 90 degrees   CHL IP OTHER RECOMMENDATIONS 11/02/2020 Recommended Consults -- Oral Care Recommendations Oral care BID Other Recommendations Have oral suction available   CHL IP FOLLOW UP RECOMMENDATIONS 11/02/2020 Follow up Recommendations None   CHL IP FREQUENCY AND DURATION 10/31/2020 Speech Therapy Frequency (ACUTE ONLY) min 3x week Treatment Duration 2 weeks      CHL IP ORAL PHASE 11/02/2020 Oral Phase Impaired Oral - Pudding Teaspoon -- Oral - Pudding Cup -- Oral - Honey Teaspoon -- Oral - Honey Cup -- Oral - Nectar Teaspoon Right anterior bolus loss;Weak lingual manipulation;Reduced posterior propulsion;Holding of bolus;Delayed oral transit;Decreased bolus cohesion;Piecemeal swallowing Oral - Nectar Cup NT Oral - Nectar Straw NT Oral - Thin Teaspoon Right anterior bolus loss;Weak lingual manipulation;Reduced posterior propulsion;Holding of bolus;Piecemeal swallowing;Delayed oral transit;Decreased bolus cohesion Oral - Thin Cup Right anterior bolus loss;Weak lingual manipulation;Reduced posterior propulsion;Holding of bolus;Piecemeal swallowing;Delayed oral transit;Decreased bolus cohesion Oral - Thin Straw Other (Comment) Oral - Puree Right anterior bolus loss;Weak lingual manipulation;Reduced posterior propulsion;Holding of bolus;Piecemeal swallowing;Delayed oral transit;Decreased bolus cohesion Oral - Mech Soft NT Oral - Regular NT Oral - Multi-Consistency NT Oral - Pill NT Oral Phase - Comment --  CHL IP PHARYNGEAL PHASE 11/02/2020 Pharyngeal Phase WFL Pharyngeal- Pudding Teaspoon -- Pharyngeal -- Pharyngeal- Pudding Cup -- Pharyngeal -- Pharyngeal- Honey Teaspoon -- Pharyngeal -- Pharyngeal- Honey Cup -- Pharyngeal --  Pharyngeal- Nectar Teaspoon -- Pharyngeal -- Pharyngeal- Nectar Cup -- Pharyngeal -- Pharyngeal- Nectar Straw -- Pharyngeal -- Pharyngeal- Thin Teaspoon -- Pharyngeal -- Pharyngeal- Thin Cup -- Pharyngeal -- Pharyngeal- Thin Straw -- Pharyngeal -- Pharyngeal- Puree -- Pharyngeal -- Pharyngeal- Mechanical Soft -- Pharyngeal -- Pharyngeal- Regular -- Pharyngeal -- Pharyngeal- Multi-consistency -- Pharyngeal -- Pharyngeal- Pill -- Pharyngeal -- Pharyngeal Comment --  CHL IP CERVICAL ESOPHAGEAL PHASE 11/02/2020 Cervical Esophageal Phase WFL Pudding Teaspoon -- Pudding Cup -- Honey Teaspoon -- Honey Cup -- Nectar Teaspoon -- Nectar Cup -- Nectar Straw -- Thin Teaspoon -- Thin Cup -- Thin Straw -- Puree -- Mechanical Soft -- Regular -- Multi-consistency -- Pill -- Cervical Esophageal Comment -- Happi B. Dreama Saa M.S., CCC-SLP, [facility] Speech-Language Pathologist Rehabilitation Services Office [phone] Happi Dreama Saa 11/02/2020, 9:30 AM                 Labs: BNP (last 3 results) No results for input(s): BNP in the last 8760 hours. Basic Metabolic Panel: Recent Labs  Lab 10/30/20 1227 10/31/20 0518 11/01/20 0435 11/02/20 0451 11/02/20 0753 11/02/20 1448 11/03/20 0605 11/04/20 0722  NA 153*   < > 151*  148*  --  147* 147* 145  K 4.1   < > 3.6 2.4*  --  3.2* 3.5 3.4*  CL 117*   < > 120* 117*  --  116* 116* 116*  CO2 17*   < > 16* 19*  --  21* 20* 17*  GLUCOSE 165*   < > 243* 177*  --  184* 159* 212*  BUN 37*   < > 26* 19  --  18 17 14   CREATININE 3.36*   < > 2.15* 1.82*  --  1.68* 1.60* 1.42*  CALCIUM 10.7*   < > 8.9 8.4*  --  8.3* 8.3* 8.8*  MG 2.6*  --  1.9  --  1.7  --  2.1 1.9   < > = values in this interval not displayed.   Liver Function Tests: Recent Labs  Lab 10/30/20 1438 10/31/20 0518 11/01/20 0435 11/02/20 0451 11/03/20 0605  AST 36 40 45* 37 25  ALT 21 22 24 26 25   ALKPHOS 77 65 57 55 55  BILITOT 1.3* 1.5* 1.0 0.7 0.5  PROT 9.0* 7.6 7.2 6.3* 5.8*  ALBUMIN 3.3* 2.9* 2.6* 2.2*  2.1*   No results for input(s): LIPASE, AMYLASE in the last 168 hours. Recent Labs  Lab 10/31/20 1300  AMMONIA 16   CBC: Recent Labs  Lab 10/31/20 0518 11/01/20 0435 11/02/20 0451 11/03/20 0605 11/04/20 0526  WBC 15.9* 12.2* 12.2* 6.9 9.0  HGB 10.3* 11.5* 10.5* 10.1* 10.5*  HCT 33.1* 35.5* 31.7* 31.0* 32.8*  MCV 86.2 84.9 82.8 83.3 85.2  PLT 391 328 311 277 257   Cardiac Enzymes: No results for input(s): CKTOTAL, CKMB, CKMBINDEX, TROPONINI in the last 168 hours. BNP: Invalid input(s): POCBNP CBG: Recent Labs  Lab 11/03/20 0728 11/03/20 1119 11/03/20 1654 11/03/20 2201 11/04/20 0755  GLUCAP 161* 225* 113* 173* 180*   D-Dimer No results for input(s): DDIMER in the last 72 hours. Hgb A1c No results for input(s): HGBA1C in the last 72 hours. Lipid Profile No results for input(s): CHOL, HDL, LDLCALC, TRIG, CHOLHDL, LDLDIRECT in the last 72 hours. Thyroid function studies No results for input(s): TSH, T4TOTAL, T3FREE, THYROIDAB in the last 72 hours.  Invalid input(s): FREET3 Anemia work up No results for input(s): VITAMINB12, FOLATE, FERRITIN, TIBC, IRON, RETICCTPCT in the last 72 hours. Urinalysis    Component Value Date/Time   COLORURINE YELLOW (A) 10/30/2020 1438   APPEARANCEUR CLOUDY (A) 10/30/2020 1438   LABSPEC 1.023 10/30/2020 1438   PHURINE 5.0 10/30/2020 1438   GLUCOSEU >=500 (A) 10/30/2020 1438   HGBUR NEGATIVE 10/30/2020 1438   BILIRUBINUR NEGATIVE 10/30/2020 1438   KETONESUR 5 (A) 10/30/2020 1438   PROTEINUR >=300 (A) 10/30/2020 1438   NITRITE NEGATIVE 10/30/2020 1438   LEUKOCYTESUR NEGATIVE 10/30/2020 1438   Sepsis Labs Invalid input(s): PROCALCITONIN,  WBC,  LACTICIDVEN Microbiology Recent Results (from the past 240 hour(s))  Urine culture     Status: Abnormal   Collection Time: 10/30/20  2:38 PM   Specimen: Urine, Random  Result Value Ref Range Status   Specimen Description   Final    URINE, RANDOM Performed at [facility],  [address], [place], [place] [address]    Special Requests   Final    NONE Performed at [facility], [address] [place], [place] [address]    Culture MULTIPLE SPECIES PRESENT, SUGGEST RECOLLECTION (A)  Final   Report Status 11/01/2020 FINAL  Final  Blood culture (routine x 2)  Status: None   Collection Time: 10/30/20  2:38 PM   Specimen: BLOOD  Result Value Ref Range Status   Specimen Description BLOOD BLOOD LEFT FOREARM  Final   Special Requests   Final    BOTTLES DRAWN AEROBIC AND ANAEROBIC Blood Culture adequate volume   Culture   Final    NO GROWTH 5 DAYS Performed at [facility], [address] [place], [place] [address]    Report Status 11/04/2020 FINAL  Final  Blood culture (routine x 2)     Status: None   Collection Time: 10/30/20  2:38 PM   Specimen: BLOOD  Result Value Ref Range Status   Specimen Description BLOOD LEFT ANTECUBITAL  Final   Special Requests   Final    BOTTLES DRAWN AEROBIC AND ANAEROBIC Blood Culture results may not be optimal due to an inadequate volume of blood received in culture bottles   Culture   Final    NO GROWTH 5 DAYS Performed at [facility], [address], [place], [place] [address]    Report Status 11/04/2020 FINAL  Final  SARS Coronavirus 2 by RT PCR (hospital order, performed in [facility] hospital lab) Nasopharyngeal Nasopharyngeal Swab     Status: None   Collection Time: 10/30/20  2:38 PM   Specimen: Nasopharyngeal Swab  Result Value Ref Range Status   SARS Coronavirus 2 NEGATIVE NEGATIVE Final    Comment: (NOTE) SARS-CoV-2 target nucleic acids are NOT DETECTED.  The SARS-CoV-2 RNA is generally detectable in upper and lower respiratory specimens during the acute phase of infection. The lowest concentration of SARS-CoV-2 viral copies this assay can detect is 250 copies / mL. A negative result does not preclude SARS-CoV-2 infection and should not be used as the sole basis for  treatment or other patient management decisions.  A negative result may occur with improper specimen collection / handling, submission of specimen other than nasopharyngeal swab, presence of viral mutation(s) within the areas targeted by this assay, and inadequate number of viral copies (<250 copies / mL). A negative result must be combined with clinical observations, patient history, and epidemiological information.  Fact Sheet for Patients:   BoilerBrush.com.cy  Fact Sheet for Healthcare Providers: https://pope.com/  This test is not yet approved or  cleared by the [place] FDA and has been authorized for detection and/or diagnosis of SARS-CoV-2 by FDA under an Emergency Use Authorization (EUA).  This EUA will remain in effect (meaning this test can be used) for the duration of the COVID-19 declaration under Section 564(b)(1) of the Act, 21 U.S.C. section 360bbb-3(b)(1), unless the authorization is terminated or revoked sooner.  Performed at [facility], [address] [place], [place] [address]   Group A Strep by PCR [facility] Only)     Status: None   Collection Time: 10/30/20  2:38 PM   Specimen: Throat; Sterile Swab  Result Value Ref Range Status   Group A Strep by PCR NOT DETECTED NOT DETECTED Final    Comment: Performed at [facility], [address] [place], [place] [address]     Total time spend on discharging this patient, including the last patient exam, discussing the hospital stay, instructions for ongoing care as it relates to all pertinent caregivers, as well as preparing the medical discharge records, prescriptions, and/or referrals as applicable, is 45 minutes.    Darlin Priestly, MD  [facility] 11/04/2020, 11:03 AM

## 2020-11-04 NOTE — Telephone Encounter (Signed)
Transition Care Management Follow-up Telephone Call  Date of discharge and from where: 11/04/2020, [facility]  How have you been since you were released from the hospital? Patient is just getting home and is feeling better.  Any questions or concerns? No  Items Reviewed:  Did the pt receive and understand the discharge instructions provided? Yes   Medications obtained and verified? Yes   Other? No   Any new allergies since your discharge? No   Dietary orders reviewed? Yes  Do you have support at home? Yes   Home Care and Equipment/Supplies: Were home health services ordered? not applicable If so, what is the name of the agency? N/A  Has the agency set up a time to come to the patient's home? not applicable Were any new equipment or medical supplies ordered?  No What is the name of the medical supply agency? N/A Were you able to get the supplies/equipment? not applicable Do you have any questions related to the use of the equipment or supplies? No  Functional Questionnaire: (I = Independent and D = Dependent) ADLs: I  Bathing/Dressing- I  Meal Prep- I  Eating- I  Maintaining continence- I  Transferring/Ambulation- I  Managing Meds- I  Follow up appointments reviewed:   PCP Hospital f/u appt confirmed? Yes  Scheduled to see Dr. Selena Batten on 11/10/2020 @ 4 pm.  Specialist Hospital f/u appt confirmed? N/A   Are transportation arrangements needed? No   If their condition worsens, is the pt aware to call PCP or go to the Emergency Dept.? Yes  Was the patient provided with contact information for the PCP's office or ED? Yes  Was to pt encouraged to call back with questions or concerns? Yes

## 2020-11-04 NOTE — Plan of Care (Signed)
Pt alert and oriented x2, slowed response. Medication crushed and given with vanilla Ice cream at the request of the daughter. 5% dextrose with 1/2 saline @50ml /hr. Falls precautions remained in place, daughter at bedside. Problem: Coping: Goal: Level of anxiety will decrease 11/04/2020 0312 by 01/02/2021, RN Outcome: Progressing 11/04/2020 0311 by 01/02/2021, RN Outcome: Progressing   Problem: Pain Managment: Goal: General experience of comfort will improve 11/04/2020 0312 by 01/02/2021, RN Outcome: Progressing 11/04/2020 0311 by 01/02/2021, RN Outcome: Progressing   Problem: Safety: Goal: Ability to remain free from injury will improve 11/04/2020 0312 by 01/02/2021, RN Outcome: Progressing 11/04/2020 0311 by 01/02/2021, RN Outcome: Progressing

## 2020-11-05 NOTE — Telephone Encounter (Signed)
Noted will see patient.

## 2020-11-10 ENCOUNTER — Encounter: Payer: Self-pay | Admitting: Family Medicine

## 2020-11-10 ENCOUNTER — Other Ambulatory Visit: Payer: Self-pay

## 2020-11-10 ENCOUNTER — Ambulatory Visit (INDEPENDENT_AMBULATORY_CARE_PROVIDER_SITE_OTHER): Payer: Medicare Other | Admitting: Family Medicine

## 2020-11-10 VITALS — BP 90/50 | HR 106 | Temp 97.6°F | Ht 65.0 in | Wt 123.5 lb

## 2020-11-10 DIAGNOSIS — Z66 Do not resuscitate: Secondary | ICD-10-CM | POA: Diagnosis not present

## 2020-11-10 DIAGNOSIS — I10 Essential (primary) hypertension: Secondary | ICD-10-CM

## 2020-11-10 DIAGNOSIS — Z1159 Encounter for screening for other viral diseases: Secondary | ICD-10-CM | POA: Diagnosis not present

## 2020-11-10 DIAGNOSIS — Z23 Encounter for immunization: Secondary | ICD-10-CM

## 2020-11-10 DIAGNOSIS — E440 Moderate protein-calorie malnutrition: Secondary | ICD-10-CM

## 2020-11-10 DIAGNOSIS — Z7189 Other specified counseling: Secondary | ICD-10-CM

## 2020-11-10 DIAGNOSIS — E46 Unspecified protein-calorie malnutrition: Secondary | ICD-10-CM | POA: Insufficient documentation

## 2020-11-10 MED ORDER — HYDRALAZINE HCL 25 MG PO TABS: 25.0000 mg | ORAL_TABLET | Freq: Three times a day (TID) | ORAL | 2 refills | Status: DC

## 2020-11-10 NOTE — Assessment & Plan Note (Signed)
Discussed code status with patient. She expressed desire to not have CPR or intubation if her heart stops. Advance directive given to patient and family member to continue discussion for goals of care.

## 2020-11-10 NOTE — Assessment & Plan Note (Signed)
Pt with 16 lb weight loss since November in setting of recent regression of prior stroke symptoms following strept throat. Is working with SLP and supplementing with ensure TID. Weight up 2 lbs since hospital stay. Advised f/u in 4 weeks and continued to encourage hydration and supplements. Follow-up electrolytes from the hospital stay

## 2020-11-10 NOTE — Assessment & Plan Note (Signed)
Today endorsed she would not longer want to be FULL code. She will discuss treatment further with her family. Not interested in colon cancer screening.

## 2020-11-10 NOTE — Assessment & Plan Note (Signed)
BP low in setting of dose increases in the hospital. Decrease hydralazine 50 mg > 25 mg TID. Cont metoprolol 25 mg BID and amlodipine 10 mg. Call if continues to be low.

## 2020-11-10 NOTE — Progress Notes (Signed)
Subjective:     Andrea [name] is a 65 y.o. female presenting for Hospitalization Follow-up (Wants pneumonia and flu vaccine )     HPI  #Failure to thrive - Has HH - speech, PT, OT to help and evaluate - had strept throat and was on antibiotics - she was eating as much  - got dehydrated and confusion  - and started having tremors - no new findings or stroke but she has noticed she regression of the old stroke injuries  - is unable to check weights at home - Diet: 3 ensures per day, pureed diet   Sleeping - Ok   #HTN - bp was increased in the hospital - 126/79 and 113/75 - 122/79  Review of Systems  10/30/2020-11/04/2020: Admission - failure to thrive x 3 weeks following strept throat - RLL pneumonia - 5 days abx. Dehydration - IV hydration w/ improvement. Sinus tachy which improved with fluids. Replaced electrolytes. AKI improved with fluid. HTN - increased hydralazine 50 mg TID and lopressor to 25 mg BID  Social History   Tobacco Use  Smoking Status Former Smoker  . Quit date: 12/2016  . Years since quitting: 3.9  Smokeless Tobacco Never Used        Objective:    BP Readings from Last 3 Encounters:  11/10/20 (!) 90/50  11/04/20 (!) 142/85  10/14/20 116/77   Wt Readings from Last 3 Encounters:  11/10/20 123 lb 8 oz (56 kg)  11/02/20 121 lb 6.4 oz (55.1 kg)  08/31/20 137 lb 8 oz (62.4 kg)    BP (!) 90/50   Pulse (!) 106   Temp 97.6 F (36.4 C) (Temporal)   Ht 5\' 5"  (1.651 m)   Wt 123 lb 8 oz (56 kg)   SpO2 98%   BMI 20.55 kg/m    Physical Exam Constitutional:      General: She is not in acute distress.    Appearance: She is well-developed. She is not diaphoretic.  HENT:     Right Ear: External ear normal.     Left Ear: External ear normal.     Nose: Nose normal.  Eyes:     Conjunctiva/sclera: Conjunctivae normal.  Cardiovascular:     Rate and Rhythm: Normal rate and regular rhythm.     Heart sounds: No murmur heard.   Pulmonary:     Effort:  Pulmonary effort is normal. No respiratory distress.     Breath sounds: Normal breath sounds. No wheezing.  Musculoskeletal:     Cervical back: Neck supple.  Skin:    General: Skin is warm and dry.     Capillary Refill: Capillary refill takes less than 2 seconds.  Neurological:     Mental Status: She is alert.     Comments: Right sided hemiparesis stable  Psychiatric:        Mood and Affect: Mood normal.        Behavior: Behavior normal.           Assessment & Plan:   Problem List Items Addressed This Visit      Cardiovascular and Mediastinum   Essential hypertension - Primary    BP low in setting of dose increases in the hospital. Decrease hydralazine 50 mg > 25 mg TID. Cont metoprolol 25 mg BID and amlodipine 10 mg. Call if continues to be low.       Relevant Medications   hydrALAZINE (APRESOLINE) 25 MG tablet   Other Relevant Orders   Basic metabolic panel  Other   Advanced care planning/counseling discussion    Today endorsed she would not longer want to be FULL code. She will discuss treatment further with her family. Not interested in colon cancer screening.       DNR (do not resuscitate)    Discussed code status with patient. She expressed desire to not have CPR or intubation if her heart stops. Advance directive given to patient and family member to continue discussion for goals of care.       Protein-calorie malnutrition (HCC)    Pt with 16 lb weight loss since November in setting of recent regression of prior stroke symptoms following strept throat. Is working with SLP and supplementing with ensure TID. Weight up 2 lbs since hospital stay. Advised f/u in 4 weeks and continued to encourage hydration and supplements. Follow-up electrolytes from the hospital stay       Other Visit Diagnoses    Need for hepatitis C screening test       Relevant Orders   Hepatitis C antibody   Need for influenza vaccination       Relevant Orders   Flu Vaccine QUAD 36+ mos  IM   Need for 23-polyvalent pneumococcal polysaccharide vaccine       Relevant Orders   Pneumococcal polysaccharide vaccine 23-valent greater than or equal to 2yo subcutaneous/IM       Return in about 4 weeks (around 12/08/2020) for weight check.  [name], MD  This visit occurred during the SARS-CoV-2 public health emergency.  Safety protocols were in place, including screening questions prior to the visit, additional usage of staff PPE, and extensive cleaning of exam room while observing appropriate contact time as indicated for disinfecting solutions.

## 2020-11-10 NOTE — Patient Instructions (Addendum)
Blood pressure - Continue metoprolol 25 mg twice daily - Decreased hydralazine to 25 mg three times a day (can take 20 mg until you run out of old prescription)  Call if blood pressure continues to be <110/65 -- would want to decrease medication   Or if heart rate is <60  Continue to work on eating and doing regular ensure until you are eating more meals.

## 2020-11-11 LAB — BASIC METABOLIC PANEL
BUN: 16 mg/dL (ref 6–23)
CO2: 26 mEq/L (ref 19–32)
Calcium: 9.8 mg/dL (ref 8.4–10.5)
Chloride: 103 mEq/L (ref 96–112)
Creatinine, Ser: 1.53 mg/dL — ABNORMAL HIGH (ref 0.40–1.20)
GFR: 36.1 mL/min — ABNORMAL LOW (ref >60.00)
Glucose, Bld: 361 mg/dL — ABNORMAL HIGH (ref 70–99)
Potassium: 3.9 mEq/L (ref 3.5–5.1)
Sodium: 139 mEq/L (ref 135–145)

## 2020-11-11 LAB — HEPATITIS C ANTIBODY
Hepatitis C Ab: NONREACTIVE
SIGNAL TO CUT-OFF: 0.01 (ref <1.00)

## 2020-12-14 ENCOUNTER — Ambulatory Visit (INDEPENDENT_AMBULATORY_CARE_PROVIDER_SITE_OTHER): Payer: Medicare Other | Admitting: Family Medicine

## 2020-12-14 ENCOUNTER — Other Ambulatory Visit: Payer: Self-pay

## 2020-12-14 ENCOUNTER — Encounter: Payer: Self-pay | Admitting: Family Medicine

## 2020-12-14 VITALS — BP 118/78 | HR 83 | Temp 98.0°F | Ht 65.0 in | Wt 129.5 lb

## 2020-12-14 DIAGNOSIS — Z8673 Personal history of transient ischemic attack (TIA), and cerebral infarction without residual deficits: Secondary | ICD-10-CM

## 2020-12-14 DIAGNOSIS — I10 Essential (primary) hypertension: Secondary | ICD-10-CM

## 2020-12-14 DIAGNOSIS — R3981 Functional urinary incontinence: Secondary | ICD-10-CM | POA: Diagnosis not present

## 2020-12-14 DIAGNOSIS — E440 Moderate protein-calorie malnutrition: Secondary | ICD-10-CM

## 2020-12-14 DIAGNOSIS — R531 Weakness: Secondary | ICD-10-CM | POA: Diagnosis not present

## 2020-12-14 NOTE — Assessment & Plan Note (Signed)
BP improved with lower dose of hydralazine 25 mg TID. Cont metoprolol 25 mg bid, amlodipine 10 mg

## 2020-12-14 NOTE — Assessment & Plan Note (Signed)
Improving. Working with [facility] PT and OT with improvement in the weakness that had occurred. Return 6 months or sooner  prn

## 2020-12-14 NOTE — Progress Notes (Signed)
Subjective:     Andrea [name] is a 65 y.o. female presenting for Follow-up (4 week- HT)     HPI   #Stroke - OT and PT and SLP coming to the house - seems to be doing well - eating well - eating solids and normal foods again - gaining weight and doing well  #Nocturnial incontinence - urinating overnight - pt notes she does not wake up with urge but has already urinated - drinks before bed - watches tv in bed - has tried nightlight w/o success   Review of Systems   Social History   Tobacco Use  Smoking Status Former Smoker  . Quit date: 12/2016  . Years since quitting: 4.0  Smokeless Tobacco Never Used        Objective:    BP Readings from Last 3 Encounters:  12/14/20 118/78  11/10/20 (!) 90/50  11/04/20 (!) 142/85   Wt Readings from Last 3 Encounters:  12/14/20 129 lb 8 oz (58.7 kg)  11/10/20 123 lb 8 oz (56 kg)  11/02/20 121 lb 6.4 oz (55.1 kg)    BP 118/78   Pulse 83   Temp 98 F (36.7 C) (Temporal)   Ht 5\' 5"  (1.651 m)   Wt 129 lb 8 oz (58.7 kg)   SpO2 97%   BMI 21.55 kg/m    Physical Exam Constitutional:      General: She is not in acute distress.    Appearance: She is well-developed. She is not diaphoretic.  HENT:     Right Ear: External ear normal.     Left Ear: External ear normal.  Eyes:     Conjunctiva/sclera: Conjunctivae normal.  Cardiovascular:     Rate and Rhythm: Normal rate and regular rhythm.     Heart sounds: No murmur heard.   Pulmonary:     Effort: Pulmonary effort is normal. No respiratory distress.     Breath sounds: Normal breath sounds. No wheezing.  Musculoskeletal:     Cervical back: Neck supple.     Comments: In wheelchair. Right leg in brace  Skin:    General: Skin is warm and dry.     Capillary Refill: Capillary refill takes less than 2 seconds.  Neurological:     Mental Status: She is alert. Mental status is at baseline.  Psychiatric:        Mood and Affect: Mood normal.        Behavior: Behavior  normal.           Assessment & Plan:   Problem List Items Addressed This Visit      Cardiovascular and Mediastinum   Essential hypertension    BP improved with lower dose of hydralazine 25 mg TID. Cont metoprolol 25 mg bid, amlodipine 10 mg        Nervous and Auditory   Right sided weakness    Improving. Working with [facility] PT and OT with improvement in the weakness that had occurred. 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 Discussed decreasing nighttime hydration and scheduled before bed bathroom. Urology consult if needed.       Protein-calorie malnutrition (HCC)    Pt doing well with 6 lb weight gain. Approaching prior weight of ~136. Continue SLP and supplements.  Return in about 6 months (around 06/16/2021) for for check in .  [name], MD  This visit occurred during the SARS-CoV-2 public health emergency.  Safety protocols were in place, including screening questions prior to the visit, additional usage of staff PPE, and extensive cleaning of exam room while observing appropriate contact time as indicated for disinfecting solutions.

## 2020-12-14 NOTE — Assessment & Plan Note (Signed)
Unclear if immobility is major factor. Discussed decreasing nighttime hydration and scheduled before bed bathroom. Urology consult if needed.

## 2020-12-14 NOTE — Patient Instructions (Signed)
Nighttime Urination  1) Stop drinking 2 hours before bed 2) Go to the bathroom before bed 3) If watching TV for more than 1 hour get up and use the bathroom when you start to feel tired, before falling asleep  Call or Mychart if no improvement and can plan for urology referral

## 2020-12-14 NOTE — Assessment & Plan Note (Signed)
Pt doing well with 6 lb weight gain. Approaching prior weight of ~136. Continue SLP and supplements.

## 2021-01-17 ENCOUNTER — Other Ambulatory Visit: Payer: Self-pay | Admitting: Family Medicine

## 2021-01-17 DIAGNOSIS — E785 Hyperlipidemia, unspecified: Secondary | ICD-10-CM

## 2021-01-28 ENCOUNTER — Ambulatory Visit: Payer: Medicare Other | Admitting: Family Medicine

## 2021-02-12 ENCOUNTER — Other Ambulatory Visit: Payer: Self-pay | Admitting: *Deleted

## 2021-02-12 DIAGNOSIS — I10 Essential (primary) hypertension: Secondary | ICD-10-CM

## 2021-02-12 MED ORDER — METOPROLOL TARTRATE 25 MG PO TABS: 25.0000 mg | ORAL_TABLET | Freq: Two times a day (BID) | ORAL | 5 refills | Status: DC

## 2021-04-19 ENCOUNTER — Other Ambulatory Visit: Payer: Self-pay | Admitting: Family Medicine

## 2021-04-19 DIAGNOSIS — I10 Essential (primary) hypertension: Secondary | ICD-10-CM

## 2021-06-15 ENCOUNTER — Telehealth: Payer: Self-pay | Admitting: Family Medicine

## 2021-06-15 NOTE — Telephone Encounter (Signed)
LVM for pt to rtn my call to schedule AWV with [facility]. Please schedule AWV with [facility] if pt calls the office.  

## 2021-06-17 ENCOUNTER — Ambulatory Visit (INDEPENDENT_AMBULATORY_CARE_PROVIDER_SITE_OTHER): Payer: Medicare Other | Admitting: Family Medicine

## 2021-06-17 ENCOUNTER — Other Ambulatory Visit: Payer: Self-pay

## 2021-06-17 ENCOUNTER — Encounter: Payer: Self-pay | Admitting: Family Medicine

## 2021-06-17 VITALS — BP 130/80 | HR 71 | Temp 97.0°F | Wt 130.5 lb

## 2021-06-17 DIAGNOSIS — Z7189 Other specified counseling: Secondary | ICD-10-CM

## 2021-06-17 DIAGNOSIS — E1169 Type 2 diabetes mellitus with other specified complication: Secondary | ICD-10-CM | POA: Diagnosis not present

## 2021-06-17 DIAGNOSIS — E785 Hyperlipidemia, unspecified: Secondary | ICD-10-CM | POA: Diagnosis not present

## 2021-06-17 DIAGNOSIS — I69320 Aphasia following cerebral infarction: Secondary | ICD-10-CM

## 2021-06-17 DIAGNOSIS — D649 Anemia, unspecified: Secondary | ICD-10-CM

## 2021-06-17 DIAGNOSIS — R3981 Functional urinary incontinence: Secondary | ICD-10-CM

## 2021-06-17 DIAGNOSIS — R531 Weakness: Secondary | ICD-10-CM

## 2021-06-17 DIAGNOSIS — I10 Essential (primary) hypertension: Secondary | ICD-10-CM | POA: Diagnosis not present

## 2021-06-17 DIAGNOSIS — Z5320 Procedure and treatment not carried out because of patient's decision for unspecified reasons: Secondary | ICD-10-CM

## 2021-06-17 DIAGNOSIS — N3944 Nocturnal enuresis: Secondary | ICD-10-CM

## 2021-06-17 DIAGNOSIS — J4520 Mild intermittent asthma, uncomplicated: Secondary | ICD-10-CM | POA: Diagnosis not present

## 2021-06-17 MED ORDER — PRAVASTATIN SODIUM 80 MG PO TABS
80.0000 mg | ORAL_TABLET | Freq: Every day | ORAL | 3 refills | Status: DC
Start: 2021-06-17 — End: 2022-06-14

## 2021-06-17 MED ORDER — HYDRALAZINE HCL 25 MG PO TABS: 25.0000 mg | ORAL_TABLET | Freq: Three times a day (TID) | ORAL | 3 refills | Status: DC

## 2021-06-17 MED ORDER — AMLODIPINE BESYLATE 10 MG PO TABS: 10.0000 mg | ORAL_TABLET | Freq: Every day | ORAL | 3 refills | Status: DC

## 2021-06-17 MED ORDER — METOPROLOL TARTRATE 25 MG PO TABS: 25.0000 mg | ORAL_TABLET | Freq: Two times a day (BID) | ORAL | 3 refills | Status: DC

## 2021-06-17 NOTE — Assessment & Plan Note (Signed)
Advanced directive provided. Again addressed code status and she would like to be full code.

## 2021-06-17 NOTE — Assessment & Plan Note (Signed)
Continues to have nocturnal incontinence will refer to urology to evaluate for other causes as continent during the day.

## 2021-06-17 NOTE — Patient Instructions (Signed)
Call back to get physical therapy referral  #Referral - Urology  I have placed a referral to a specialist for you. You should receive a phone call from the specialty office. Make sure your voicemail is not full and that if you are able to answer your phone to unknown or new numbers.   It may take up to 2 weeks to hear about the referral. If you do not hear anything in 2 weeks, please call our office and ask to speak with the referral coordinator.

## 2021-06-17 NOTE — Assessment & Plan Note (Signed)
Lab Results  Component Value Date   LDLCALC 80 01/30/2020   Recheck lipids. Goal <70. Cont pravastatin 80 mg

## 2021-06-17 NOTE — Assessment & Plan Note (Signed)
Declined breast cancer screening. Understands the risk of developing breast cancer. Is not interested in screening

## 2021-06-17 NOTE — Assessment & Plan Note (Signed)
Controlled. Cont hydralazine 25 mg tid, metoprolol 25 mg bid, amlodipine 10 mg

## 2021-06-17 NOTE — Assessment & Plan Note (Signed)
CBGs have been elevated. Check HgbA1c. Anticipate we may need to restart metformin. Cont statin.

## 2021-06-17 NOTE — Assessment & Plan Note (Signed)
Pt with brace for foot. Will need referral to PT at the first of the year to get new brace.

## 2021-06-17 NOTE — Progress Notes (Signed)
Subjective:     Andrea [name] is a 65 y.o. female presenting for Follow-up (6 months)     HPI  Declines preventative cancer screening  #Diabetes - stopped metformin - has not been checking - missed a follow-up  #HTN - has been taking medications - doing well  #advanced care planning - would like to be full code  #incontinence - thinks she sleeps very deep - daytime has improved -   Review of Systems   Social History   Tobacco Use  Smoking Status Former   Types: Cigarettes   Quit date: 12/2016   Years since quitting: 4.5  Smokeless Tobacco Never        Objective:    BP Readings from Last 3 Encounters:  06/17/21 130/80  12/14/20 118/78  11/10/20 (!) 90/50   Wt Readings from Last 3 Encounters:  06/17/21 130 lb 8 oz (59.2 kg)  12/14/20 129 lb 8 oz (58.7 kg)  11/10/20 123 lb 8 oz (56 kg)    BP 130/80   Pulse 71   Temp (!) 97 F (36.1 C) (Temporal)   Wt 130 lb 8 oz (59.2 kg)   SpO2 99%   BMI 21.72 kg/m    Physical Exam Constitutional:      General: She is not in acute distress.    Appearance: She is well-developed. She is not diaphoretic.  HENT:     Right Ear: External ear normal.     Left Ear: External ear normal.  Eyes:     Conjunctiva/sclera: Conjunctivae normal.  Cardiovascular:     Rate and Rhythm: Normal rate and regular rhythm.     Heart sounds: No murmur heard. Pulmonary:     Effort: Pulmonary effort is normal. No respiratory distress.     Breath sounds: Normal breath sounds. No wheezing.  Musculoskeletal:     Cervical back: Neck supple.  Skin:    General: Skin is warm and dry.     Capillary Refill: Capillary refill takes less than 2 seconds.  Neurological:     Mental Status: She is alert. Mental status is at baseline.     Comments: Hemiplegia   Psychiatric:        Mood and Affect: Mood normal.        Behavior: Behavior normal.          Assessment & Plan:   Problem List Items Addressed This Visit        Cardiovascular and Mediastinum   Essential hypertension - Primary    Controlled. Cont hydralazine 25 mg tid, metoprolol 25 mg bid, amlodipine 10 mg       Relevant Medications   amLODipine (NORVASC) 10 MG tablet   metoprolol tartrate (LOPRESSOR) 25 MG tablet   hydrALAZINE (APRESOLINE) 25 MG tablet   pravastatin (PRAVACHOL) 80 MG tablet   Other Relevant Orders   Comprehensive metabolic panel     Endocrine   Type 2 diabetes mellitus with other specified complication (HCC)    CBGs have been elevated. Check HgbA1c. Anticipate we may need to restart metformin. Cont statin.       Relevant Medications   pravastatin (PRAVACHOL) 80 MG tablet   Other Relevant Orders   Hemoglobin A1c     Other   Advanced care planning/counseling discussion    Advanced directive provided. Again addressed code status and she would like to be full code.       HLD (hyperlipidemia)    Lab Results  Component Value Date   LDLCALC 80  01/30/2020  Recheck lipids. Goal <70. 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       Colon cancer screening declined    Declined colon cancer screening again.       Breast screening declined    Declined breast cancer screening. Understands the risk of developing breast cancer. Is not interested in screening      Other Visit Diagnoses     Anemia, unspecified type       Relevant Orders   CBC   Nocturnal enuresis       Relevant Orders   Ambulatory referral to Urology      Unable to do eye exam due to aphasia    Return in about 6 months (around 12/15/2021) for pending labs - may be sooner  .  [name], MD  This visit occurred during the SARS-CoV-2 public health emergency.  Safety protocols were in place, including screening questions prior to the visit, additional usage of staff PPE, and extensive cleaning of exam room while observing appropriate contact time as indicated for disinfecting solutions.

## 2021-06-17 NOTE — Assessment & Plan Note (Signed)
Declined colon cancer screening again.

## 2021-06-17 NOTE — Assessment & Plan Note (Signed)
Unable to complete eye exam 2/2 to aphasia. Will advise annual eye exam

## 2021-06-18 LAB — LIPID PANEL
Cholesterol: 181 mg/dL (ref 0–200)
HDL: 70.1 mg/dL (ref >39.00)
LDL Cholesterol: 85 mg/dL (ref 0–99)
NonHDL: 111.18
Total CHOL/HDL Ratio: 3
Triglycerides: 129 mg/dL (ref 0.0–149.0)
VLDL: 25.8 mg/dL (ref 0.0–40.0)

## 2021-06-18 LAB — COMPREHENSIVE METABOLIC PANEL
ALT: 22 U/L (ref 0–35)
AST: 19 U/L (ref 0–37)
Albumin: 4.4 g/dL (ref 3.5–5.2)
Alkaline Phosphatase: 65 U/L (ref 39–117)
BUN: 23 mg/dL (ref 6–23)
CO2: 26 mEq/L (ref 19–32)
Calcium: 11.3 mg/dL — ABNORMAL HIGH (ref 8.4–10.5)
Chloride: 102 mEq/L (ref 96–112)
Creatinine, Ser: 1.52 mg/dL — ABNORMAL HIGH (ref 0.40–1.20)
GFR: 36.23 mL/min — ABNORMAL LOW (ref >60.00)
Glucose, Bld: 124 mg/dL — ABNORMAL HIGH (ref 70–99)
Potassium: 3.8 mEq/L (ref 3.5–5.1)
Sodium: 139 mEq/L (ref 135–145)
Total Bilirubin: 0.4 mg/dL (ref 0.2–1.2)
Total Protein: 8.3 g/dL (ref 6.0–8.3)

## 2021-06-18 LAB — CBC
HCT: 38.7 % (ref 36.0–46.0)
Hemoglobin: 12.5 g/dL (ref 12.0–15.0)
MCHC: 32.3 g/dL (ref 30.0–36.0)
MCV: 84.5 fl (ref 78.0–100.0)
Platelets: 322 10*3/uL (ref 150.0–400.0)
RBC: 4.58 Mil/uL (ref 3.87–5.11)
RDW: 14.9 % (ref 11.5–15.5)
WBC: 7.5 10*3/uL (ref 4.0–10.5)

## 2021-06-18 LAB — HEMOGLOBIN A1C: Hgb A1c MFr Bld: 6.9 % — ABNORMAL HIGH (ref 4.6–6.5)

## 2021-06-19 ENCOUNTER — Ambulatory Visit (INDEPENDENT_AMBULATORY_CARE_PROVIDER_SITE_OTHER): Payer: Medicare Other

## 2021-06-19 ENCOUNTER — Telehealth: Payer: Self-pay

## 2021-06-19 DIAGNOSIS — Z0000 Encounter for general adult medical examination without abnormal findings: Secondary | ICD-10-CM | POA: Diagnosis not present

## 2021-06-19 NOTE — Progress Notes (Signed)
Subjective:   Andrea [name] is a 65 y.o. female who presents for Medicare Annual (Subsequent) preventive examination.  I connected with  Andrea [name] on 06/19/21 by an audio only telemedicine application and verified that I am speaking with the correct person using two identifiers.   I discussed the limitations, risks, security and privacy concerns of performing an evaluation and management service by telephone and the availability of in person appointments. I also discussed with the patient that there may be a patient responsible charge related to this service. The patient expressed understanding and verbally consented to this telephonic visit.  Location of Patient: Home with her [name] Location of Provider: office  Persons participating in the call: patient, Andrea [name], patient's [name]- Andrea [name], and Andrea [name], RMA Ms Rudean Curt helped patient during the phone call answering questions except for 6CIT, due to patient has aphasia. Review of Systems    Defer to PCP      Objective:    There were no vitals filed for this visit. There is no height or weight on file to calculate BMI.  Advanced Directives 06/19/2021 10/30/2020  Does Patient Have a Medical Advance Directive? Yes No  Type of Advance Directive [place] in Chart? Yes - validated most recent copy scanned in chart (See row information) -  Would patient like information on creating a medical advance directive? - No - Patient declined    Current Medications (verified) Outpatient Encounter Medications as of 06/19/2021  Medication Sig   amantadine (SYMMETREL) 100 MG capsule Take 1 capsule (100 mg total) by mouth 2 (two) times daily.   amLODipine (NORVASC) 10 MG tablet Take 1 tablet (10 mg total) by mouth daily.   aspirin EC 81 MG tablet Take 81 mg by mouth daily.   Cholecalciferol 25 MCG (1000 UT) tablet Take 2,000 Units by mouth daily.   feeding  supplement (ENSURE ENLIVE / ENSURE PLUS) LIQD Take 237 mLs by mouth 3 (three) times daily between meals.   hydrALAZINE (APRESOLINE) 25 MG tablet Take 1 tablet (25 mg total) by mouth 3 (three) times daily.   metoprolol tartrate (LOPRESSOR) 25 MG tablet Take 1 tablet (25 mg total) by mouth 2 (two) times daily.   Multiple Vitamins-Minerals (CENTRUM SILVER PO) Take 1 tablet by mouth daily.   OVER THE COUNTER MEDICATION OMEGA 3 FISH OIL. 2000 MG. 1 DAILY   polyethylene glycol (MIRALAX / GLYCOLAX) 17 g packet Take 17 g by mouth 2 (two) times daily as needed.   pravastatin (PRAVACHOL) 80 MG tablet Take 1 tablet (80 mg total) by mouth daily.   Probiotic Product (PROBIOTIC-10 PO) Take by mouth. 1 tablet daily   vitamin C (ASCORBIC ACID) 500 MG tablet Take 500 mg by mouth daily.   No facility-administered encounter medications on file as of 06/19/2021.    Allergies (verified) Citric acid and Citrus   History: Past Medical History:  Diagnosis Date   Stroke [facility])    Past Surgical History:  Procedure Laterality Date   BREAST BIOPSY Left    CESAREAN SECTION     4    CRANIOPLASTY Left 08/08/2017   CRANIOTOMY FOR HEMISPHERECTOMY TOTAL / PARTIAL Left 02/16/2017   PATELLA FRACTURE SURGERY Right    PERCUTANEOUS ENDOSCOPIC GASTROSTOMY (PEG) REMOVAL     TUBAL LIGATION     Family History  Problem Relation Age of Onset   Stroke Mother    Diabetes Mother    Hypertension Mother  Aneurysm Father    Diabetes Sister    Hypertension Sister    Diabetes Sister    Hypertension Sister    Hypertension Sister    Hypertension Sister    Social History   Socioeconomic History   Marital status: Single    Spouse name: Not on file   Number of children: 4   Years of education: Master's degree   Highest education level: Not on file  Occupational History   Not on file  Tobacco Use   Smoking status: Former    Packs/day: 0.25    Years: 30.00    Pack years: 7.50    Types: Cigarettes    Quit date:  12/2016    Years since quitting: 4.5   Smokeless tobacco: Never  Vaping Use   Vaping Use: Never used  Substance and Sexual Activity   Alcohol use: Never   Drug use: Never   Sexual activity: Not Currently  Other Topics Concern   Not on file  Social History Narrative   Lives with Andrea [name] ([name]), also with Andrea [name], and her other [name] Andrea [name]) and children   Just moved to the area from [place], [place]   Social Determinants of Radio broadcast assistant Strain: Low Risk    Difficulty of Paying Living Expenses: Not hard at all  Food Insecurity: No Food Insecurity   Worried About Charity fundraiser in the Last Year: Never true   Arboriculturist in the Last Year: Never true  Transportation Needs: No Transportation Needs   Lack of Transportation (Medical): No   Lack of Transportation (Non-Medical): No  Physical Activity: Inactive   Days of Exercise per Week: 0 days   Minutes of Exercise per Session: 0 min  Stress: No Stress Concern Present   Feeling of Stress : Not at all  Social Connections: Socially Isolated   Frequency of Communication with Friends and Family: Once a week   Frequency of Social Gatherings with Friends and Family: Once a week   Attends Religious Services: Never   Marine scientist or Organizations: No   Attends Archivist Meetings: Never   Marital Status: Widowed    Tobacco Counseling Counseling given: Not Answered   Clinical Intake:  Pre-visit preparation completed: Yes  Pain : No/denies pain     BMI - recorded: 21.72 Nutritional Status: BMI of 19-24  Normal Nutritional Risks: None Diabetes: Yes CBG done?: No CBG resulted in Enter/ Edit results?: No Did pt. bring in CBG monitor from home?: No  How often do you need to have someone help you when you read instructions, pamphlets, or other written materials from your doctor or pharmacy?: 5 - Always What is the last grade level you completed in school?:  has masters degree  Diabetic?Yes  Interpreter Needed?: No    Activities of Daily Living In your present state of health, do you have any difficulty performing the following activities: 06/19/2021 10/31/2020  Hearing? Y N  Vision? Y Y  Difficulty concentrating or making decisions? Andrea [name]  Walking or climbing stairs? Y Y  Dressing or bathing? N Y  Doing errands, shopping? Andrea [name]  Some recent data might be hidden    Patient Care Team: Lesleigh Noe, MD as PCP - General (Family Medicine) Dr Potter-neurology [facility]-eye provider  Indicate any recent Medical Services you may have received from other than [facility] providers in the past year (date may be approximate).     Assessment:  This is a routine wellness examination for Andrea [name].  Hearing/Vision screen No results found.  Dietary issues and exercise activities discussed: Current Exercise Habits: The patient does not participate in regular exercise at present, Exercise limited by: orthopedic condition(s);neurologic condition(s)   Goals Addressed   None   Depression Screen PHQ 2/9 Scores 06/19/2021 08/31/2020  PHQ - 2 Score 0 0    Fall Risk Fall Risk  06/19/2021  Falls in the past year? 0  Number falls in past yr: 0  Injury with Fall? 0  Risk for fall due to : Impaired balance/gait;Other (Comment);Orthopedic patient    FALL RISK PREVENTION PERTAINING TO THE HOME:  Any stairs in or around the home? Yes  If so, are there any without handrails? Yes  Home free of loose throw rugs in walkways, pet beds, electrical cords, etc? Yes  Adequate lighting in your home to reduce risk of falls? Yes   ASSISTIVE DEVICES UTILIZED TO PREVENT FALLS:  Life alert? No  Use of a cane, walker or w/c? Yes  Grab bars in the bathroom? No  Shower chair or bench in shower? Yes  Elevated toilet seat or a handicapped toilet? Yes   TIMED UP AND GO:  Was the test performed?  N/A phone call visit .   Cognitive Function:    tr5 6CIT  Screen 06/19/2021  What Year? 0 points  What month? 0 points  What time? 3 points  Count back from 20 4 points  Months in reverse 4 points  Repeat phrase 10 points  Total Score 21    Immunizations Immunization History  Administered Date(s) Administered   Influenza, Quadrivalent, Recombinant, Inj, Pf 08/10/2017   Influenza,inj,Quad PF,6+ Mos 11/10/2020   MMR 04/22/1996, 03/24/1998   Pneumococcal Polysaccharide-23 11/10/2020   Tdap 05/12/2016   Zoster, Live 05/14/2016    TDAP status: Up to date  Flu Vaccine status: Due, Education has been provided regarding the importance of this vaccine. Advised may receive this vaccine at local pharmacy or Health Dept. Aware to provide a copy of the vaccination record if obtained from local pharmacy or Health Dept. Verbalized acceptance and understanding.  Pneumococcal vaccine status: Up to date  Covid-19 vaccine status: Declined, Education has been provided regarding the importance of this vaccine but patient still declined. Advised may receive this vaccine at local pharmacy or Health Dept.or vaccine clinic. Aware to provide a copy of the vaccination record if obtained from local pharmacy or Health Dept. Verbalized acceptance and understanding.  Qualifies for Shingles Vaccine? Yes   Zostavax completed No   Shingrix Completed?: No.    Education has been provided regarding the importance of this vaccine. Patient has been advised to call insurance company to determine out of pocket expense if they have not yet received this vaccine. Advised may also receive vaccine at local pharmacy or Health Dept. Verbalized acceptance and understanding.  Screening Tests Health Maintenance  Topic Date Due   FOOT EXAM  04/14/2020   OPHTHALMOLOGY EXAM  01/16/2021   INFLUENZA VACCINE  08/20/2021 (Originally 05/03/2021)   MAMMOGRAM  06/19/2022 (Originally 10/12/2007)   COLONOSCOPY (Pts 45-90yr Insurance coverage will need to be confirmed)  06/19/2022 (Originally  10/11/2002)   COVID-19 Vaccine (1) 07/19/2022 (Originally 04/10/1956)   Zoster Vaccines- Shingrix (1 of 2) 09/19/2022 (Originally 10/12/2007)   HEMOGLOBIN A1C  12/15/2021   TETANUS/TDAP  05/12/2026   Hepatitis C Screening  Completed   HIV Screening  Completed   HPV VACCINES  Aged Out   PAP SMEAR-Modifier  Discontinued    Health Maintenance  Health Maintenance Due  Topic Date Due   FOOT EXAM  04/14/2020   OPHTHALMOLOGY EXAM  01/16/2021    Colorectal screening: Declined  Mammogram status: Declined  Bone Density status: Declined  Lung Cancer Screening: (Low Dose CT Chest recommended if Age 4-80 years, 30 pack-year currently smoking OR have quit w/in 15years.) does not qualify.   Lung Cancer Screening Referral: n/a  Additional Screening:  Hepatitis C Screening: does not qualify  Vision Screening: Recommended annual ophthalmology exams for early detection of glaucoma and other disorders of the eye. Is the patient up to date with their annual eye exam?  No  Who is the provider or what is the name of the office in which the patient attends annual eye exams? [place] office. Patient and her [name] advised to follow up with their office to schedule another visit.  Dental Screening: Recommended annual dental exams for proper oral hygiene  Community Resource Referral / Chronic Care Management: CRR required this visit?  No   CCM required this visit?  No      Plan:     I have personally reviewed and noted the following in the patient's chart:   Medical and social history Use of alcohol, tobacco or illicit drugs  Current medications and supplements including opioid prescriptions.  Functional ability and status Nutritional status Physical activity Advanced directives List of other physicians Hospitalizations, surgeries, and ER visits in previous 12 months Vitals Screenings to include cognitive, depression, and falls Referrals and appointments  In addition, I  have reviewed and discussed with patient certain preventive protocols, quality metrics, and best practice recommendations. A written personalized care plan for preventive services as well as general preventive health recommendations were provided to patient.     Andrea [name], CMA   06/19/2021   Nurse Notes: Non-Face to Face 30 minute visit.   Ms. Horsfall ,  Thank you for taking time to come for your Medicare Wellness Visit. I appreciate your ongoing commitment to your health goals. Please review the following plan we discussed and let me know if I can assist you in the future.   These are the goals we discussed:  Goals   None     This is a list of the screening recommended for you and due dates:  Health Maintenance  Topic Date Due   Complete foot exam   04/14/2020   Eye exam for diabetics  01/16/2021   Flu Shot  08/20/2021*   Mammogram  06/19/2022*   Colon Cancer Screening  06/19/2022*   COVID-19 Vaccine (1) 07/19/2022*   Zoster (Shingles) Vaccine (1 of 2) 09/19/2022*   Hemoglobin A1C  12/15/2021   Tetanus Vaccine  05/12/2026   Hepatitis C Screening: USPSTF Recommendation to screen - Ages 18-79 yo.  Completed   HIV Screening  Completed   HPV Vaccine  Aged Out   Pap Smear  Discontinued  *Topic was postponed. The date shown is not the original due date.

## 2021-06-19 NOTE — Telephone Encounter (Signed)
Discussed with patient and her daughter today during AWV phone call. Need to send form for DNR to the patient to fill out.

## 2021-06-19 NOTE — Patient Instructions (Signed)
Health Maintenance, Female Adopting a healthy lifestyle and getting preventive care are important in promoting health and wellness. Ask your health care provider about: The right schedule for you to have regular tests and exams. Things you can do on your own to prevent diseases and keep yourself healthy. What should I know about diet, weight, and exercise? Eat a healthy diet  Eat a diet that includes plenty of vegetables, fruits, low-fat dairy products, and lean protein. Do not eat a lot of foods that are high in solid fats, added sugars, or sodium. Maintain a healthy weight Body mass index (BMI) is used to identify weight problems. It estimates body fat based on height and weight. Your health care provider can help determine your BMI and help you achieve or maintain a healthy weight. Get regular exercise Get regular exercise. This is one of the most important things you can do for your health. Most adults should: Exercise for at least 150 minutes each week. The exercise should increase your heart rate and make you sweat (moderate-intensity exercise). Do strengthening exercises at least twice a week. This is in addition to the moderate-intensity exercise. Spend less time sitting. Even light physical activity can be beneficial. Watch cholesterol and blood lipids Have your blood tested for lipids and cholesterol at 63 years of age, then have this test every 5 years. Have your cholesterol levels checked more often if: Your lipid or cholesterol levels are high. You are older than 63 years of age. You are at high risk for heart disease. What should I know about cancer screening? Depending on your health history and family history, you may need to have cancer screening at various ages. This may include screening for: Breast cancer. Cervical cancer. Colorectal cancer. Skin cancer. Lung cancer. What should I know about heart disease, diabetes, and high blood pressure? Blood pressure and heart  disease High blood pressure causes heart disease and increases the risk of stroke. This is more likely to develop in people who have high blood pressure readings, are of African descent, or are overweight. Have your blood pressure checked: Every 3-5 years if you are 18-39 years of age. Every year if you are 40 years old or older. Diabetes Have regular diabetes screenings. This checks your fasting blood sugar level. Have the screening done: Once every three years after age 40 if you are at a normal weight and have a low risk for diabetes. More often and at a younger age if you are overweight or have a high risk for diabetes. What should I know about preventing infection? Hepatitis B If you have a higher risk for hepatitis B, you should be screened for this virus. Talk with your health care provider to find out if you are at risk for hepatitis B infection. Hepatitis C Testing is recommended for: Everyone born from 1945 through 1965. Anyone with known risk factors for hepatitis C. Sexually transmitted infections (STIs) Get screened for STIs, including gonorrhea and chlamydia, if: You are sexually active and are younger than 63 years of age. You are older than 63 years of age and your health care provider tells you that you are at risk for this type of infection. Your sexual activity has changed since you were last screened, and you are at increased risk for chlamydia or gonorrhea. Ask your health care provider if you are at risk. Ask your health care provider about whether you are at high risk for HIV. Your health care provider may recommend a prescription medicine   to help prevent HIV infection. If you choose to take medicine to prevent HIV, you should first get tested for HIV. You should then be tested every 3 months for as long as you are taking the medicine. Pregnancy If you are about to stop having your period (premenopausal) and you may become pregnant, seek counseling before you get  pregnant. Take 400 to 800 micrograms (mcg) of folic acid every day if you become pregnant. Ask for birth control (contraception) if you want to prevent pregnancy. Osteoporosis and menopause Osteoporosis is a disease in which the bones lose minerals and strength with aging. This can result in bone fractures. If you are 65 years old or older, or if you are at risk for osteoporosis and fractures, ask your health care provider if you should: Be screened for bone loss. Take a calcium or vitamin D supplement to lower your risk of fractures. Be given hormone replacement therapy (HRT) to treat symptoms of menopause. Follow these instructions at home: Lifestyle Do not use any products that contain nicotine or tobacco, such as cigarettes, e-cigarettes, and chewing tobacco. If you need help quitting, ask your health care provider. Do not use street drugs. Do not share needles. Ask your health care provider for help if you need support or information about quitting drugs. Alcohol use Do not drink alcohol if: Your health care provider tells you not to drink. You are pregnant, may be pregnant, or are planning to become pregnant. If you drink alcohol: Limit how much you use to 0-1 drink a day. Limit intake if you are breastfeeding. Be aware of how much alcohol is in your drink. In the [place] one drink equals one 12 oz bottle of beer (355 mL), one 5 oz glass of wine (148 mL), or one 1 oz glass of hard liquor (44 mL). General instructions Schedule regular health, dental, and eye exams. Stay current with your vaccines. Tell your health care provider if: You often feel depressed. You have ever been abused or do not feel safe at home. Summary Adopting a healthy lifestyle and getting preventive care are important in promoting health and wellness. Follow your health care provider's instructions about healthy diet, exercising, and getting tested or screened for diseases. Follow your health care provider's  instructions on monitoring your cholesterol and blood pressure. This information is not intended to replace advice given to you by your health care provider. Make sure you discuss any questions you have with your health care provider. Document Revised: 11/27/2020 Document Reviewed: 09/12/2018 Elsevier Patient Education  2022 Elsevier Inc.  

## 2021-06-21 NOTE — Telephone Encounter (Signed)
Spoke with patient's daughter about DNR form today, patient does not want DNR form. If anything happens to patient she wants to be resuscitated. This is just for [facility] for the chart.

## 2021-06-24 ENCOUNTER — Other Ambulatory Visit: Payer: Self-pay | Admitting: *Deleted

## 2021-06-24 DIAGNOSIS — N3944 Nocturnal enuresis: Secondary | ICD-10-CM

## 2021-06-25 ENCOUNTER — Other Ambulatory Visit
Admission: RE | Admit: 2021-06-25 | Discharge: 2021-06-25 | Disposition: A | Discharge status: Home / Self Care | Payer: Medicare Other | Attending: Urology | Admitting: Urology

## 2021-06-25 ENCOUNTER — Other Ambulatory Visit: Payer: Self-pay

## 2021-06-25 ENCOUNTER — Ambulatory Visit (INDEPENDENT_AMBULATORY_CARE_PROVIDER_SITE_OTHER): Payer: Medicare Other | Admitting: Urology

## 2021-06-25 VITALS — BP 159/76 | HR 71 | Ht 66.0 in | Wt 130.5 lb

## 2021-06-25 DIAGNOSIS — N3944 Nocturnal enuresis: Secondary | ICD-10-CM | POA: Diagnosis present

## 2021-06-25 LAB — URINALYSIS, COMPLETE (UACMP) WITH MICROSCOPIC
Bilirubin Urine: NEGATIVE
Glucose, UA: NEGATIVE mg/dL
Hgb urine dipstick: NEGATIVE
Leukocytes,Ua: NEGATIVE
Nitrite: NEGATIVE
Protein, ur: >300 mg/dL — AB
Specific Gravity, Urine: 1.025 (ref 1.005–1.030)
pH: 5.5 (ref 5.0–8.0)

## 2021-06-25 LAB — BLADDER SCAN AMB NON-IMAGING: Scan Result: 67

## 2021-06-25 NOTE — Patient Instructions (Signed)
Please try an oral lubricant such as Biotene which can be purchased at any pharmacy, [employer] or grocery store as an oral lubricant.  We have also provided you samples with Gemtesa 75 mg which is an overactive bladder medication.  Please let us know at the end of the month whether or not he thought this was helpful.  He can take it either in the morning or nighttime but given her nighttime symptoms, he may want to try it in the evenings.

## 2021-06-25 NOTE — Progress Notes (Signed)
06/25/2021 6:00 PM   Andrea [name] 1956/03/26 [id number]  Referring provider: [name], MD [address] [place],  [place] [address]  Chief Complaint  Patient presents with   Nocturnal Enuresis    HPI: 65 year old female with personal history of stroke/aphasia who presents today for nocturnal enuresis.  Per Dr. Selena Batten and her healthcare proxy, she is dry in the daytime incontinent but at nighttime, she has episodes of enuresis.  She is diabetic, most recent hemoglobin A1c 6.9 8 days ago.  Urinalysis today is unremarkable other than protein and few WBCs.  She and her daughter report that at nighttime, she is completely incontinent.  She often soaks through pads and diapers.  She will not call out at nighttime or get out of bed.  Daughter states that she is a very heavy sleeper and wonders if this is a contributing factor.  The patient denies feeling the sensation of needing to urinate at night.  She denies noticing that she is wet or soaked in the morning time.  In the daytime, she is able to ambulate with a walker to the toilet is continent.  This is been going on since the time of her stroke.  Is unchanged.  Its not worsened.  No issues with constipation.  Past medical history is significant for history of stroke with left hemiparesis and aphasia.  She is accompanied today by her daughter who is her primary caretaker.  She has lots of family member and assistance.  Her daughter primarily provides most of the history but the patient is engaged in the conversation today and shakes her head yes and no appropriately to questions.   PMH: Past Medical History:  Diagnosis Date   Stroke [facility])     Surgical History: Past Surgical History:  Procedure Laterality Date   BREAST BIOPSY Left    CESAREAN SECTION     4    CRANIOPLASTY Left 08/08/2017   CRANIOTOMY FOR HEMISPHERECTOMY TOTAL / PARTIAL Left 02/16/2017   PATELLA FRACTURE SURGERY Right    PERCUTANEOUS ENDOSCOPIC  GASTROSTOMY (PEG) REMOVAL     TUBAL LIGATION      Home Medications:  Allergies as of 06/25/2021       Reactions   Citric Acid Hives   Citrus Hives        Medication List        Accurate as of June 25, 2021 11:59 PM. If you have any questions, ask your nurse or doctor.          amantadine 100 MG capsule Commonly known as: SYMMETREL Take 1 capsule (100 mg total) by mouth 2 (two) times daily.   amLODipine 10 MG tablet Commonly known as: NORVASC Take 1 tablet (10 mg total) by mouth daily.   aspirin EC 81 MG tablet Take 81 mg by mouth daily.   CENTRUM SILVER PO Take 1 tablet by mouth daily.   Cholecalciferol 25 MCG (1000 UT) tablet Take 2,000 Units by mouth daily.   erythromycin ophthalmic ointment Place 1 application into the right eye at bedtime.   feeding supplement Liqd Take 237 mLs by mouth 3 (three) times daily between meals.   hydrALAZINE 25 MG tablet Commonly known as: APRESOLINE Take 1 tablet (25 mg total) by mouth 3 (three) times daily.   metFORMIN 500 MG tablet Commonly known as: GLUCOPHAGE Take 1 tablet (500 mg total) by mouth daily with breakfast.   metoprolol tartrate 25 MG tablet Commonly known as: LOPRESSOR Take 1 tablet (25 mg  total) by mouth 2 (two) times daily.   OVER THE COUNTER MEDICATION OMEGA 3 FISH OIL. 2000 MG. 1 DAILY   polyethylene glycol 17 g packet Commonly known as: MIRALAX / GLYCOLAX Take 17 g by mouth 2 (two) times daily as needed.   pravastatin 80 MG tablet Commonly known as: PRAVACHOL Take 1 tablet (80 mg total) by mouth daily.   PROBIOTIC-10 PO Take by mouth. 1 tablet daily   vitamin C 500 MG tablet Commonly known as: ASCORBIC ACID Take 500 mg by mouth daily.        Allergies:  Allergies  Allergen Reactions   Citric Acid Hives   Citrus Hives    Family History: Family History  Problem Relation Age of Onset   Stroke Mother    Diabetes Mother    Hypertension Mother    Aneurysm Father     Diabetes Sister    Hypertension Sister    Diabetes Sister    Hypertension Sister    Hypertension Sister    Hypertension Sister     Social History:  reports that she quit smoking about 4 years ago. Her smoking use included cigarettes. She has a 7.50 pack-year smoking history. She has never used smokeless tobacco. She reports that she does not drink alcohol and does not use drugs.   Physical Exam: BP (!) 159/76   Pulse 71   Ht 5\' 6"  (1.676 m)   Wt 130 lb 8 oz (59.2 kg)   BMI 21.06 kg/m   Constitutional:  Alert and oriented, No acute distress.  Accompanied by daughter today.  Appears to be extremely well cared for. HEENT: [place] AT, moist mucus membranes.  Trachea midline, no masses. Cardiovascular: No clubbing, cyanosis, or edema. Respiratory: Normal respiratory effort, no increased work of breathing. Skin: No rashes, bruises or suspicious lesions. Neurologic: Left hemiparesis noted, in wheelchair.  Nonverbal but communicative. Psychiatric: Normal mood and affect.  Laboratory Data: Lab Results  Component Value Date   WBC 7.5 06/17/2021   HGB 12.5 06/17/2021   HCT 38.7 06/17/2021   MCV 84.5 06/17/2021   PLT 322.0 06/17/2021    Lab Results  Component Value Date   CREATININE 1.52 (H) 06/17/2021    Lab Results  Component Value Date   HGBA1C 6.9 (H) 06/17/2021    Urinalysis    Component Value Date/Time   COLORURINE YELLOW 06/25/2021 1413   APPEARANCEUR CLEAR 06/25/2021 1413   LABSPEC 1.025 06/25/2021 1413   PHURINE 5.5 06/25/2021 1413   GLUCOSEU NEGATIVE 06/25/2021 1413   HGBUR NEGATIVE 06/25/2021 1413   BILIRUBINUR NEGATIVE 06/25/2021 1413   KETONESUR TRACE (A) 06/25/2021 1413   PROTEINUR >300 (A) 06/25/2021 1413   NITRITE NEGATIVE 06/25/2021 1413   LEUKOCYTESUR NEGATIVE 06/25/2021 1413    Lab Results  Component Value Date   BACTERIA FEW (A) 06/25/2021    Pertinent Imaging: Results for orders placed or performed in visit on 06/25/21  BLADDER SCAN AMB  NON-IMAGING  Result Value Ref Range   Scan Result 67 ml     Assessment & Plan:    1. Nocturnal enuresis Stable since her stroke  No evidence of overflow incontinence is contributing factor, emptying adequately today  It sounds like there is a large behavioral component to this, the patient does not want to awaken her family and will not get out of bed by herself at nighttime for any reason.  We discussed strategies like avoidance of beverages 4 hours before bedtime if she often has a dry mouth per  her daughter.  We discussed the option of using an oral lubricant or spray instead of water at nighttime.  Additionally, we discussed potentially having a bedside commode available and discuss this further with OT about strategies for being able to ambulate herself to the bathroom and nighttime.  There may also be a sensory or overactivity component to this.  We did provide her with samples of Gemtesa 75 mg which can be taken at nighttime to see if this helps to facilitate more bladder storage in the evening.  She was given a month prescription to try.  The let us know if this is effective or helpful. - BLADDER SCAN AMB NON-IMAGING   Vanna Scotland, MD  [facility] [address], [address] [place], [place] [address] [phone]   I spent 45 total minutes on the day of the encounter including pre-visit review of the medical record, face-to-face time with the patient, and post visit ordering of labs/imaging/tests.  The primary min of time today was spent with the patient and her daughter discussing various strategies as above.

## 2021-06-30 MED ORDER — ERYTHROMYCIN 5 MG/GM OP OINT: 1.0000 "application " | TOPICAL_OINTMENT | Freq: Every day | OPHTHALMIC | 0 refills | Status: DC

## 2021-06-30 MED ORDER — METFORMIN HCL 500 MG PO TABS: 500.0000 mg | ORAL_TABLET | Freq: Every day | ORAL | 3 refills | Status: DC

## 2021-08-16 ENCOUNTER — Other Ambulatory Visit: Payer: Self-pay

## 2021-08-16 ENCOUNTER — Encounter: Payer: Self-pay | Admitting: Family Medicine

## 2021-08-16 ENCOUNTER — Telehealth (INDEPENDENT_AMBULATORY_CARE_PROVIDER_SITE_OTHER): Payer: Medicare Other | Admitting: Family Medicine

## 2021-08-16 DIAGNOSIS — J209 Acute bronchitis, unspecified: Secondary | ICD-10-CM

## 2021-08-16 DIAGNOSIS — J40 Bronchitis, not specified as acute or chronic: Secondary | ICD-10-CM | POA: Insufficient documentation

## 2021-08-16 MED ORDER — DOXYCYCLINE HYCLATE 100 MG PO TABS: 100.0000 mg | ORAL_TABLET | Freq: Two times a day (BID) | ORAL | 0 refills | Status: DC

## 2021-08-16 NOTE — Patient Instructions (Addendum)
Drink fluids and rest  mucinex DM is good for cough and congestion , or your chlorcedin product  Take the doxycycline as directed  Nasal saline for congestion as needed  Tylenol for fever or pain or headache  Please alert [place] if symptoms worsen (if severe or short of breath please go to the ER)   Update if not starting to improve in a week or if worsening

## 2021-08-16 NOTE — Assessment & Plan Note (Signed)
Due to 3 plus weeks of symptoms (but no sob) suspect bronchitis   Did not initially test for flu/covid (family had flu) but now too many days in  Mild congestion and prod cough  tx with doxycycline due to length of illness Watch closely for wheeze/sob/fever or new symptoms  Will continue sympt care with chlorcedin (for chest congestion) ER parameters reviewed Enc good fluid intake and rest

## 2021-08-16 NOTE — Progress Notes (Signed)
Virtual Visit via Video Note  I connected with Andrea [name] on 08/16/21 at  4:00 PM EST by a video enabled telemedicine application and verified that I am speaking with the correct person using two identifiers.  Location: Patient: home Provider: office   I discussed the limitations of evaluation and management by telemedicine and the availability of in person appointments. The patient expressed understanding and agreed to proceed.  Parties involved in encounter  Patient: Andrea [name] Daughter Andrea [name] (legal guardian)  Provider:  Roxy Manns MD   History of Present Illness: Pt presents with productive cough 65 yo pt of Dr Selena Batten with h/o stroke in the past  Has had pna in the past also  Started over 3 weeks ago with cold symptoms   Coughing a lot - sounds like bronchitis  Some congestion  No fever  Tissue with colored mucous  Several cases of flu in the house  She gets bronchitis easily  Has been drinking fluids  Stuffy nose  No headache but has had one  No sinus pain    Some pain to cough  No sob  No inhalers and past h/o asthma) Has a neb machine but not medicine to put in it - years ago   No drug allergies   No n/v/d   Family recently had the flu    Otc: Chlorcedin multi symptom (for chest congestion)    Patient Active Problem List   Diagnosis Date Noted   Acute bronchitis 08/16/2021   Colon cancer screening declined 06/17/2021   Breast screening declined 06/17/2021   Protein-calorie malnutrition (HCC) 11/10/2020   Debility 10/30/2020   Urinary incontinence due to immobility 10/17/2019   Asthma 01/07/2019   HLD (hyperlipidemia) 01/07/2019   History of stroke 10/01/2018   Aphasia as late effect of stroke 10/01/2018   Right sided weakness 10/01/2018   Essential hypertension 10/01/2018   Type 2 diabetes mellitus with other specified complication (HCC) 10/01/2018   Advanced care planning/counseling discussion 10/01/2018   Past Medical History:   Diagnosis Date   Stroke [facility])    Past Surgical History:  Procedure Laterality Date   BREAST BIOPSY Left    CESAREAN SECTION     4    CRANIOPLASTY Left 08/08/2017   CRANIOTOMY FOR HEMISPHERECTOMY TOTAL / PARTIAL Left 02/16/2017   PATELLA FRACTURE SURGERY Right    PERCUTANEOUS ENDOSCOPIC GASTROSTOMY (PEG) REMOVAL     TUBAL LIGATION     Social History   Tobacco Use   Smoking status: Former    Packs/day: 0.25    Years: 30.00    Pack years: 7.50    Types: Cigarettes    Quit date: 12/2016    Years since quitting: 4.7   Smokeless tobacco: Never  Vaping Use   Vaping Use: Never used  Substance Use Topics   Alcohol use: Never   Drug use: Never   Family History  Problem Relation Age of Onset   Stroke Mother    Diabetes Mother    Hypertension Mother    Aneurysm Father    Diabetes Sister    Hypertension Sister    Diabetes Sister    Hypertension Sister    Hypertension Sister    Hypertension Sister    Allergies  Allergen Reactions   Citric Acid Hives   Citrus Hives   Current Outpatient Medications on File Prior to Visit  Medication Sig Dispense Refill   amantadine (SYMMETREL) 100 MG capsule Take 1 capsule (100 mg total) by mouth 2 (two) times  daily. 180 capsule 3   amLODipine (NORVASC) 10 MG tablet Take 1 tablet (10 mg total) by mouth daily. 90 tablet 3   aspirin EC 81 MG tablet Take 81 mg by mouth daily.     Cholecalciferol 25 MCG (1000 UT) tablet Take 2,000 Units by mouth daily.     feeding supplement (ENSURE ENLIVE / ENSURE PLUS) LIQD Take 237 mLs by mouth 3 (three) times daily between meals. 237 mL 12   hydrALAZINE (APRESOLINE) 25 MG tablet Take 1 tablet (25 mg total) by mouth 3 (three) times daily. 270 tablet 3   metFORMIN (GLUCOPHAGE) 500 MG tablet Take 1 tablet (500 mg total) by mouth daily with breakfast. 90 tablet 3   metoprolol tartrate (LOPRESSOR) 25 MG tablet Take 1 tablet (25 mg total) by mouth 2 (two) times daily. 180 tablet 3   Multiple Vitamins-Minerals  (CENTRUM SILVER PO) Take 1 tablet by mouth daily.     OVER THE COUNTER MEDICATION OMEGA 3 FISH OIL. 2000 MG. 1 DAILY     polyethylene glycol (MIRALAX / GLYCOLAX) 17 g packet Take 17 g by mouth 2 (two) times daily as needed. 14 each 0   pravastatin (PRAVACHOL) 80 MG tablet Take 1 tablet (80 mg total) by mouth daily. 90 tablet 3   Probiotic Product (PROBIOTIC-10 PO) Take by mouth. 1 tablet daily     vitamin C (ASCORBIC ACID) 500 MG tablet Take 500 mg by mouth daily.     No current facility-administered medications on file prior to visit.   Review of Systems  Constitutional:  Negative for chills, fever and malaise/fatigue.  HENT:  Negative for congestion, ear pain, sinus pain and sore throat.   Eyes:  Negative for blurred vision, discharge and redness.  Respiratory:  Positive for cough and sputum production. Negative for shortness of breath, wheezing and stridor.   Cardiovascular:  Negative for chest pain, palpitations and leg swelling.  Gastrointestinal:  Negative for abdominal pain, diarrhea, nausea and vomiting.  Musculoskeletal:  Negative for myalgias.  Skin:  Negative for rash.  Neurological:  Positive for headaches. Negative for dizziness.   Observations/Objective: Patient appears well, in no distress Weight is baseline  No facial swelling or asymmetry Normal voice-not hoarse and no slurred speech No obvious tremor or mobility impairment Moving neck and UEs normally Able to hear the call well  No wheeze or shortness of breath during interview  Cough is mind and sounds junky Pleasant, attentive, guardian gives much of the history No skin changes on face or neck , no rash or pallor Affect is normal    Assessment and Plan: Problem List Items Addressed This Visit       Respiratory   Acute bronchitis    Due to 3 plus weeks of symptoms (but no sob) suspect bronchitis   Did not initially test for flu/covid (family had flu) but now too many days in  Mild congestion and prod cough   tx with doxycycline due to length of illness Watch closely for wheeze/sob/fever or new symptoms  Will continue sympt care with chlorcedin (for chest congestion) ER parameters reviewed Enc good fluid intake and rest          Follow Up Instructions: Drink fluids and rest  mucinex DM is good for cough and congestion , or your chlorcedin product  Take the doxycycline as directed  Nasal saline for congestion as needed  Tylenol for fever or pain or headache  Please alert [place] if symptoms worsen (if severe or short  of breath please go to the ER)   Update if not starting to improve in a week or if worsening     I discussed the assessment and treatment plan with the patient. The patient was provided an opportunity to ask questions and all were answered. The patient agreed with the plan and demonstrated an understanding of the instructions.   The patient was advised to call back or seek an in-person evaluation if the symptoms worsen or if the condition fails to improve as anticipated.     Roxy Manns, MD

## 2021-10-06 ENCOUNTER — Telehealth (INDEPENDENT_AMBULATORY_CARE_PROVIDER_SITE_OTHER): Payer: Medicare Other | Admitting: Nurse Practitioner

## 2021-10-06 ENCOUNTER — Encounter: Payer: Self-pay | Admitting: Nurse Practitioner

## 2021-10-06 ENCOUNTER — Other Ambulatory Visit: Payer: Self-pay

## 2021-10-06 VITALS — BP 138/74 | HR 76

## 2021-10-06 DIAGNOSIS — J40 Bronchitis, not specified as acute or chronic: Secondary | ICD-10-CM | POA: Diagnosis not present

## 2021-10-06 MED ORDER — DOXYCYCLINE HYCLATE 100 MG PO TABS: 100.0000 mg | ORAL_TABLET | Freq: Two times a day (BID) | ORAL | 0 refills | Status: AC

## 2021-10-06 NOTE — Assessment & Plan Note (Signed)
Given patient's history and length of symptoms will elect to treat with antibiotics.  We will put patient on doxycycline 100 mg twice daily for 7 days.  Did discuss signs and symptoms when to be seen in urgent urgent care setting.  Patient and daughter acknowledged

## 2021-10-06 NOTE — Progress Notes (Signed)
Patient ID: Andrea [name], female    DOB: 22-Jul-1956, 66 y.o.   MRN: [id number]  Virtual visit completed through [employer], a video enabled telemedicine application. Due to national recommendations of social distancing due to COVID-19, a virtual visit is felt to be most appropriate for this patient at this time. Reviewed limitations, risks, security and privacy concerns of performing a virtual visit and the availability of in person appointments. I also reviewed that there may be a patient responsible charge related to this service. The patient agreed to proceed.   Patient location: home Provider location: [place] at [facility], office Persons participating in this virtual visit: patient, provider, daughter   If any vitals were documented, they were collected by patient at home unless specified below.    BP 138/74 Comment: per patient's daughter this morning   Pulse 76    SpO2 97%    CC: URI Subjective:   HPI: Andrea [name] is a 66 y.o. female presenting on 10/06/2021 for Cough (Sx started around 09/26/21. Productive yellow phlegm, sore throat, runny nose, post nasal drip, No fever. Covid test negative on 10/03/20. Has been taking Corisiden cold/chest congestion, and corisiden flu OTC, Mucinnex DM.)  Symptoms started around 09/26/2021 Covid test at home and negative on 10/03/2021 No covid vaccine States daughter was sick but was wearing a mask around her Coricidin and mucinex DM with some help   Relevant past medical, surgical, family and social history reviewed and updated as indicated. Interim medical history since our last visit reviewed. Allergies and medications reviewed and updated. Outpatient Medications Prior to Visit  Medication Sig Dispense Refill   amantadine (SYMMETREL) 100 MG capsule Take 1 capsule (100 mg total) by mouth 2 (two) times daily. 180 capsule 3   amLODipine (NORVASC) 10 MG tablet Take 1 tablet (10 mg total) by mouth daily. 90 tablet 3   aspirin EC 81 MG tablet  Take 81 mg by mouth daily.     Cholecalciferol 25 MCG (1000 UT) tablet Take 2,000 Units by mouth daily.     feeding supplement (ENSURE ENLIVE / ENSURE PLUS) LIQD Take 237 mLs by mouth 3 (three) times daily between meals. 237 mL 12   hydrALAZINE (APRESOLINE) 25 MG tablet Take 1 tablet (25 mg total) by mouth 3 (three) times daily. 270 tablet 3   metFORMIN (GLUCOPHAGE) 500 MG tablet Take 1 tablet (500 mg total) by mouth daily with breakfast. 90 tablet 3   metoprolol tartrate (LOPRESSOR) 25 MG tablet Take 1 tablet (25 mg total) by mouth 2 (two) times daily. 180 tablet 3   Multiple Vitamins-Minerals (CENTRUM SILVER PO) Take 1 tablet by mouth daily.     OVER THE COUNTER MEDICATION OMEGA 3 FISH OIL. 2000 MG. 1 DAILY     polyethylene glycol (MIRALAX / GLYCOLAX) 17 g packet Take 17 g by mouth 2 (two) times daily as needed. 14 each 0   pravastatin (PRAVACHOL) 80 MG tablet Take 1 tablet (80 mg total) by mouth daily. 90 tablet 3   Probiotic Product (PROBIOTIC-10 PO) Take by mouth. 1 tablet daily     vitamin C (ASCORBIC ACID) 500 MG tablet Take 500 mg by mouth daily.     doxycycline (VIBRA-TABS) 100 MG tablet Take 1 tablet (100 mg total) by mouth 2 (two) times daily. 14 tablet 0   No facility-administered medications prior to visit.     Per HPI unless specifically indicated in ROS section below Review of Systems  Constitutional:  Positive for fatigue. Negative for  chills and fever.  HENT:  Positive for congestion and sore throat. Negative for ear discharge, ear pain, sinus pressure and sinus pain.   Respiratory:  Positive for cough (yellow). Negative for shortness of breath.   Cardiovascular:  Negative for chest pain.  Gastrointestinal:  Negative for abdominal pain, diarrhea, nausea and vomiting.  Musculoskeletal:  Positive for myalgias. Negative for arthralgias.  Neurological:  Negative for headaches.  Objective:  BP 138/74 Comment: per patient's daughter this morning   Pulse 76    SpO2 97%   Wt  Readings from Last 3 Encounters:  06/25/21 130 lb 8 oz (59.2 kg)  06/17/21 130 lb 8 oz (59.2 kg)  12/14/20 129 lb 8 oz (58.7 kg)       Physical exam: Gen: alert, NAD, not ill appearing Pulm: speaks in complete sentences without increased work of breathing Psych: normal mood, normal thought content      Results for orders placed or performed during the hospital encounter of 06/25/21  Urinalysis, Complete w Microscopic  Result Value Ref Range   Color, Urine YELLOW YELLOW   APPearance CLEAR CLEAR   Specific Gravity, Urine 1.025 1.005 - 1.030   pH 5.5 5.0 - 8.0   Glucose, UA NEGATIVE NEGATIVE mg/dL   Hgb urine dipstick NEGATIVE NEGATIVE   Bilirubin Urine NEGATIVE NEGATIVE   Ketones, ur TRACE (A) NEGATIVE mg/dL   Protein, ur >716 (A) NEGATIVE mg/dL   Nitrite NEGATIVE NEGATIVE   Leukocytes,Ua NEGATIVE NEGATIVE   Squamous Epithelial / LPF 0-5 0 - 5   Non Squamous Epithelial PRESENT (A) NONE SEEN   WBC, UA 6-10 0 - 5 WBC/hpf   RBC / HPF 0-5 0 - 5 RBC/hpf   Bacteria, UA FEW (A) NONE SEEN   Hyaline Casts, UA PRESENT    Assessment & Plan:   Problem List Items Addressed This Visit       Respiratory   Bronchitis - Primary    Given patient's history and length of symptoms will elect to treat with antibiotics.  We will put patient on doxycycline 100 mg twice daily for 7 days.  Did discuss signs and symptoms when to be seen in urgent urgent care setting.  Patient and daughter acknowledged      Relevant Medications   doxycycline (VIBRA-TABS) 100 MG tablet     Meds ordered this encounter  Medications   doxycycline (VIBRA-TABS) 100 MG tablet    Sig: Take 1 tablet (100 mg total) by mouth 2 (two) times daily for 7 days.    Dispense:  14 tablet    Refill:  0    Order Specific Question:   Supervising Provider    Answer:   TOWER, MARNE A [[id number]]   No orders of the defined types were placed in this encounter.   I discussed the assessment and treatment plan with the patient. The  patient was provided an opportunity to ask questions and all were answered. The patient agreed with the plan and demonstrated an understanding of the instructions. The patient was advised to call back or seek an in-person evaluation if the symptoms worsen or if the condition fails to improve as anticipated.  Follow up plan: No follow-ups on file.  Audria Nine, NP

## 2021-10-24 ENCOUNTER — Encounter: Payer: Self-pay | Admitting: Nurse Practitioner

## 2021-12-16 ENCOUNTER — Ambulatory Visit: Payer: Medicare Other | Admitting: Family Medicine

## 2021-12-20 ENCOUNTER — Ambulatory Visit (INDEPENDENT_AMBULATORY_CARE_PROVIDER_SITE_OTHER): Payer: Medicare Other | Admitting: Family Medicine

## 2021-12-20 ENCOUNTER — Encounter: Payer: Self-pay | Admitting: Family Medicine

## 2021-12-20 ENCOUNTER — Other Ambulatory Visit: Payer: Self-pay

## 2021-12-20 VITALS — BP 146/80 | HR 66 | Ht 66.0 in | Wt 138.8 lb

## 2021-12-20 DIAGNOSIS — N1832 Chronic kidney disease, stage 3b: Secondary | ICD-10-CM | POA: Diagnosis not present

## 2021-12-20 DIAGNOSIS — I10 Essential (primary) hypertension: Secondary | ICD-10-CM

## 2021-12-20 DIAGNOSIS — E1169 Type 2 diabetes mellitus with other specified complication: Secondary | ICD-10-CM | POA: Diagnosis not present

## 2021-12-20 DIAGNOSIS — E1121 Type 2 diabetes mellitus with diabetic nephropathy: Secondary | ICD-10-CM | POA: Insufficient documentation

## 2021-12-20 LAB — POCT GLYCOSYLATED HEMOGLOBIN (HGB A1C): Hemoglobin A1C: 6.3 % — AB (ref 4.0–5.6)

## 2021-12-20 MED ORDER — LOSARTAN POTASSIUM 50 MG PO TABS: 50.0000 mg | ORAL_TABLET | Freq: Every day | ORAL | 0 refills | Status: DC

## 2021-12-20 NOTE — Progress Notes (Signed)
? ?Subjective:  ? ?  ?Andrea [name] is a 66 y.o. female presenting for Follow-up ?  ? ? ?HPI ? ?#HTN ?- checks daily ?- occasionally 140s/60s ?- does have DBP 50s ?- amlodipine, hydralazine, metoprolol ? ? ?#Diabetes ?- not following a diabetic diet ?- taking metformin ?- has been doing ensure ? ?Review of Systems ? ? ?Social History  ? ?Tobacco Use  ?Smoking Status Former  ? Packs/day: 0.25  ? Years: 30.00  ? Pack years: 7.50  ? Types: Cigarettes  ? Quit date: 12/2016  ? Years since quitting: 5.0  ?Smokeless Tobacco Never  ? ? ? ?   ?Objective:  ?  ?BP Readings from Last 3 Encounters:  ?12/20/21 (!) 146/80  ?10/06/21 138/74  ?08/16/21 139/74  ? ?Wt Readings from Last 3 Encounters:  ?12/20/21 138 lb 12.8 oz (63 kg)  ?06/25/21 130 lb 8 oz (59.2 kg)  ?06/17/21 130 lb 8 oz (59.2 kg)  ? ? ?BP (!) 146/80 (BP Location: Left Arm, Patient Position: Sitting, Cuff Size: Normal)   Pulse 66   Ht 5\' 6"  (1.676 m)   Wt 138 lb 12.8 oz (63 kg)   SpO2 98%   BMI 22.40 kg/m?  ? ? ?Physical Exam ?Constitutional:   ?   General: She is not in acute distress. ?   Appearance: She is well-developed. She is not diaphoretic.  ?HENT:  ?   Right Ear: External ear normal.  ?   Left Ear: External ear normal.  ?   Nose: Nose normal.  ?Eyes:  ?   Conjunctiva/sclera: Conjunctivae normal.  ?Cardiovascular:  ?   Rate and Rhythm: Normal rate and regular rhythm.  ?Pulmonary:  ?   Effort: Pulmonary effort is normal. No respiratory distress.  ?   Breath sounds: Normal breath sounds. No wheezing.  ?Musculoskeletal:  ?   Cervical back: Neck supple.  ?Skin: ?   General: Skin is warm and dry.  ?   Capillary Refill: Capillary refill takes less than 2 seconds.  ?Neurological:  ?   Mental Status: She is alert. Mental status is at baseline.  ?   Comments: In wheelchair  ?Psychiatric:     ?   Mood and Affect: Mood normal.     ?   Behavior: Behavior normal.  ? ? ? ? ? ?   ?Assessment & Plan:  ? ?Problem List Items Addressed This Visit   ? ?  ? Cardiovascular and  Mediastinum  ? Essential hypertension - Primary  ?  Blood pressure slightly elevated, unclear why she is not on an ACE or an ARB.  Discussed starting losartan 50 mg.  Stop hydralazine and monitor home blood pressures.  Continue amlodipine 10 mg and metoprolol 25 mg twice daily.  1 for below work in approximately 1 to 3 weeks. ?  ?  ? Relevant Medications  ? losartan (COZAAR) 50 MG tablet  ? Other Relevant Orders  ? Basic metabolic panel  ?  ? Endocrine  ? Type 2 diabetes mellitus with other specified complication (HCC)  ?  Lab Results  ?Component Value Date  ? HGBA1C 6.3 (A) 12/20/2021  ?Good control.  Continue metformin 500 mg daily. ?  ?  ? Relevant Medications  ? losartan (COZAAR) 50 MG tablet  ? Other Relevant Orders  ? POCT HgB A1C (Completed)  ?  ? Genitourinary  ? Stage 3b chronic kidney disease (HCC)  ?  Start losartan 50 mg daily.  Repeat labs in a couple of weeks. ?  ?  ? ? ? ?  Return in about 6 months (around 06/22/2022) for annual physical. ? ?[name], MD ? ?This visit occurred during the SARS-CoV-2 public health emergency.  Safety protocols were in place, including screening questions prior to the visit, additional usage of staff PPE, and extensive cleaning of exam room while observing appropriate contact time as indicated for disinfecting solutions.  ? ?

## 2021-12-20 NOTE — Assessment & Plan Note (Signed)
Start losartan 50 mg daily.  Repeat labs in a couple of weeks. ?

## 2021-12-20 NOTE — Assessment & Plan Note (Signed)
Lab Results  ?Component Value Date  ? HGBA1C 6.3 (A) 12/20/2021  ? ?Good control.  Continue metformin 500 mg daily. ?

## 2021-12-20 NOTE — Patient Instructions (Addendum)
Hypertension ?- Stop Hydralazine 25 mg three times daily ?- Continue Amlodipine and Metoprolol ?- Start Losartan 50 mg ?- if the blood pressure is low, we can try a lower dose of losartan ? ?Update with home readings in 2 weeks ?- return for labs in 2 weeks ? ?Diabetes is looking good - Hemoglobin A1c 6.3  ? ? ?

## 2021-12-20 NOTE — Assessment & Plan Note (Signed)
Blood pressure slightly elevated, unclear why she is not on an ACE or an ARB.  Discussed starting losartan 50 mg.  Stop hydralazine and monitor home blood pressures.  Continue amlodipine 10 mg and metoprolol 25 mg twice daily.  1 for below work in approximately 1 to 3 weeks. ?

## 2021-12-27 ENCOUNTER — Other Ambulatory Visit: Payer: Self-pay

## 2021-12-27 ENCOUNTER — Other Ambulatory Visit (INDEPENDENT_AMBULATORY_CARE_PROVIDER_SITE_OTHER): Payer: Medicare Other

## 2021-12-27 DIAGNOSIS — I10 Essential (primary) hypertension: Secondary | ICD-10-CM | POA: Diagnosis not present

## 2021-12-27 LAB — BASIC METABOLIC PANEL
BUN: 22 mg/dL (ref 6–23)
CO2: 27 mEq/L (ref 19–32)
Calcium: 10.5 mg/dL (ref 8.4–10.5)
Chloride: 103 mEq/L (ref 96–112)
Creatinine, Ser: 1.54 mg/dL — ABNORMAL HIGH (ref 0.40–1.20)
GFR: 35.54 mL/min — ABNORMAL LOW (ref >60.00)
Glucose, Bld: 145 mg/dL — ABNORMAL HIGH (ref 70–99)
Potassium: 3.9 mEq/L (ref 3.5–5.1)
Sodium: 140 mEq/L (ref 135–145)

## 2021-12-28 ENCOUNTER — Encounter: Payer: Self-pay | Admitting: Family Medicine

## 2021-12-28 ENCOUNTER — Ambulatory Visit: Payer: Medicare Other

## 2021-12-30 IMAGING — CT CT HEAD W/O CM
3 of 4 series · 14 of 47 positions shown, 16 images · non-contrast
Comparison: None.

CLINICAL DATA: Mental status change.

EXAM:
CT HEAD WITHOUT CONTRAST
TECHNIQUE: Contiguous axial images were obtained from the base of the skull
through the vertex without intravenous contrast.

[Series 3: head wo · axial · 0.43mm/px · z∈[+349,+479]mm · 8 of 30 slices shown, 10 images]
[im 2/30  brain]
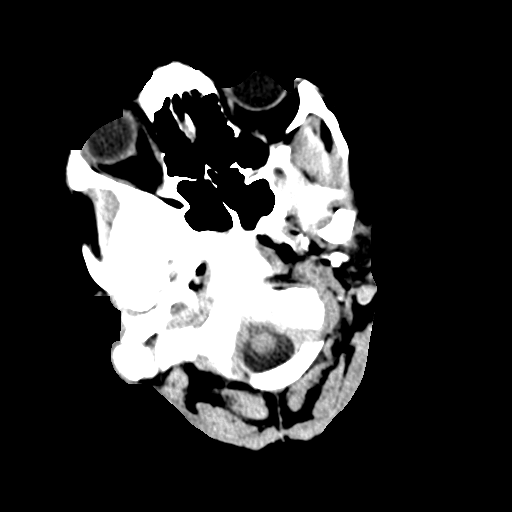
[im 2/30  bone]
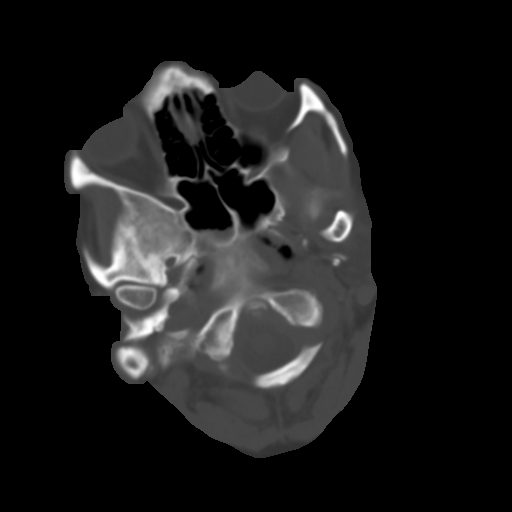
[im 6/30  brain]
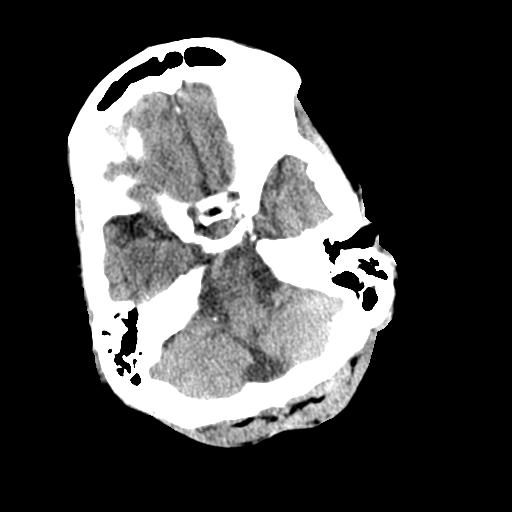
[im 10/30  brain]
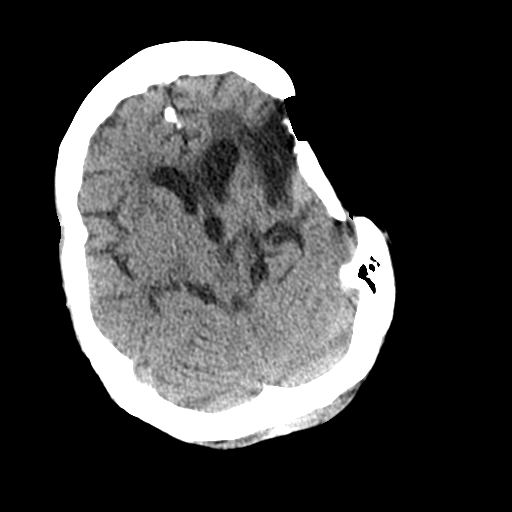
[im 14/30  brain]
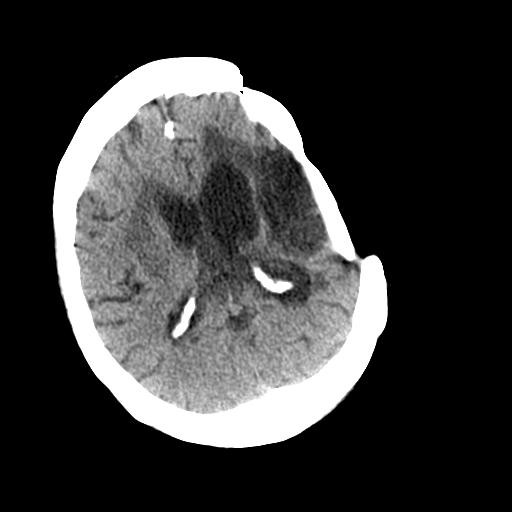
[im 16/30  brain]
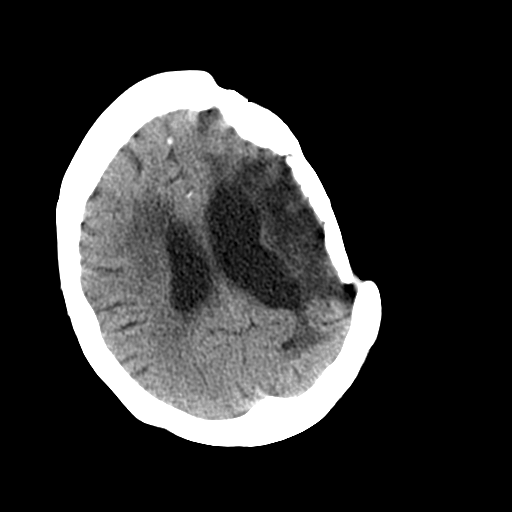
[im 16/30  bone]
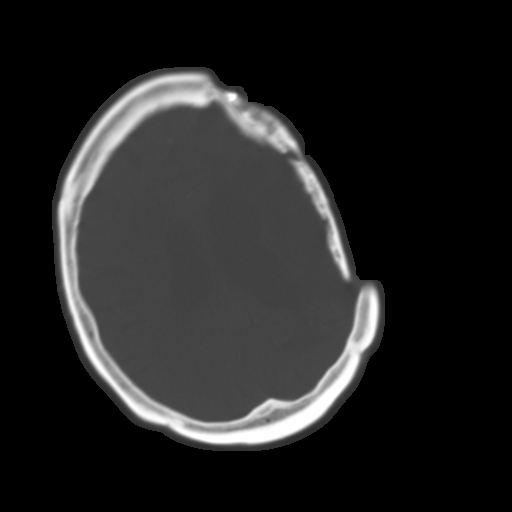
[im 20/30  brain]
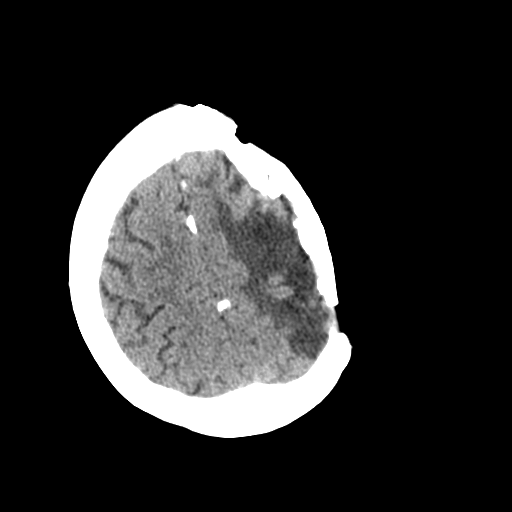
[im 24/30  brain]
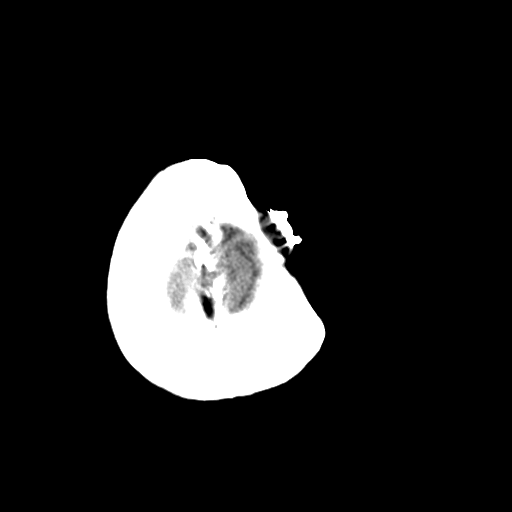
[im 28/30  brain]
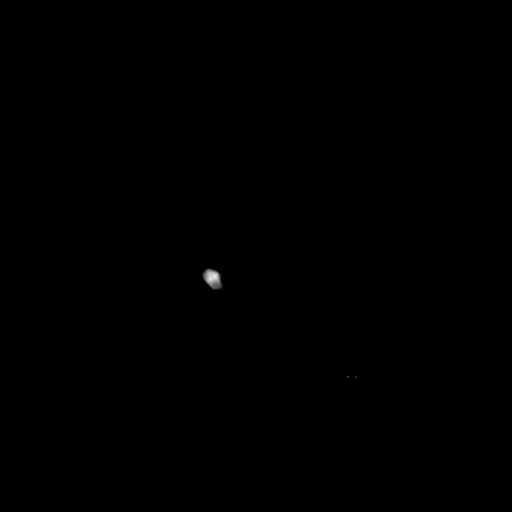

[Series 4: coronal soft tissue · coronal · 0.30mm/px · 3 of 59 slices shown]
[im 20/59  brain]
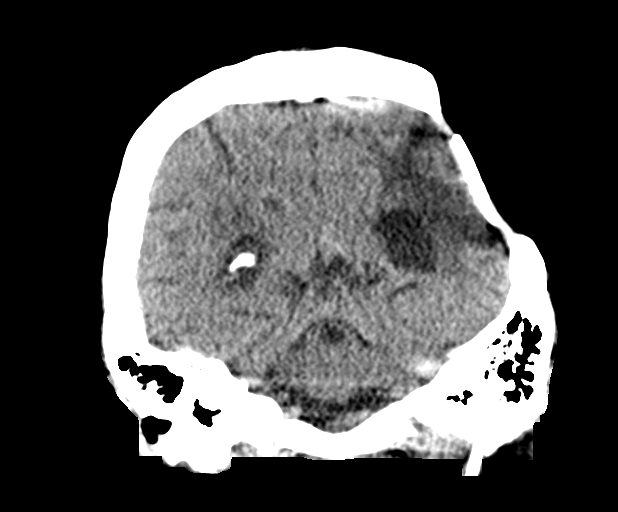
[im 26/59  brain]
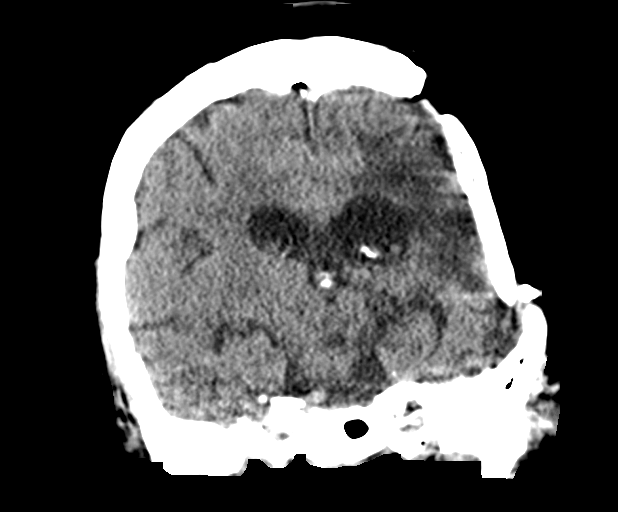
[im 33/59  brain]
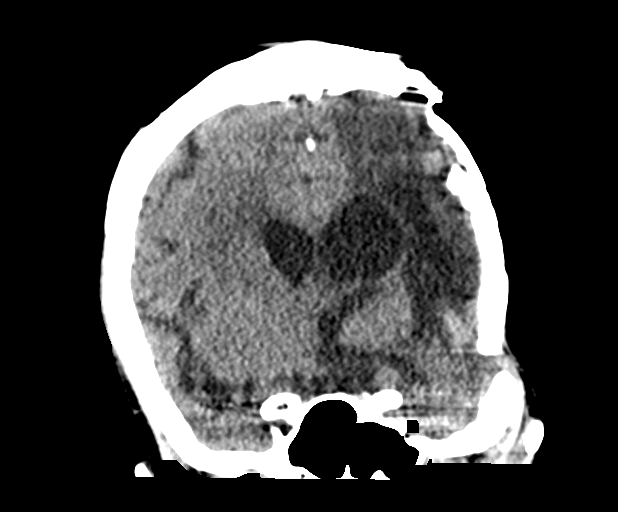

[Series 5: sagittal soft tissue · sagittal · 0.28mm/px · 3 of 50 slices shown]
[im 18/50  brain]
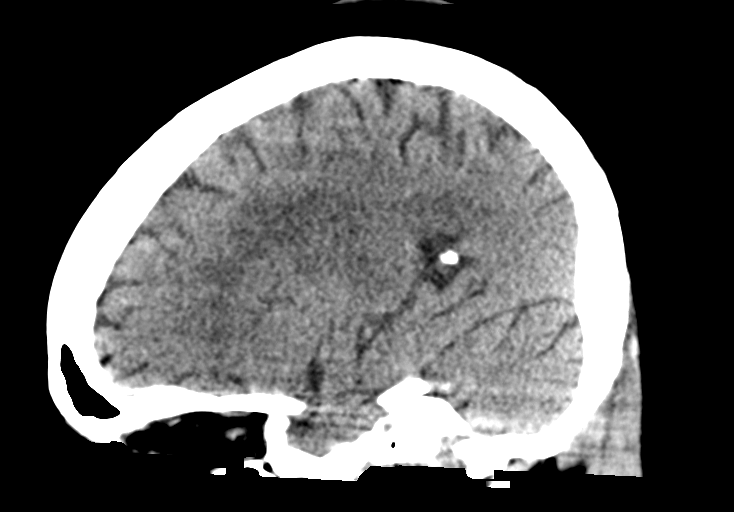
[im 25/50  brain]
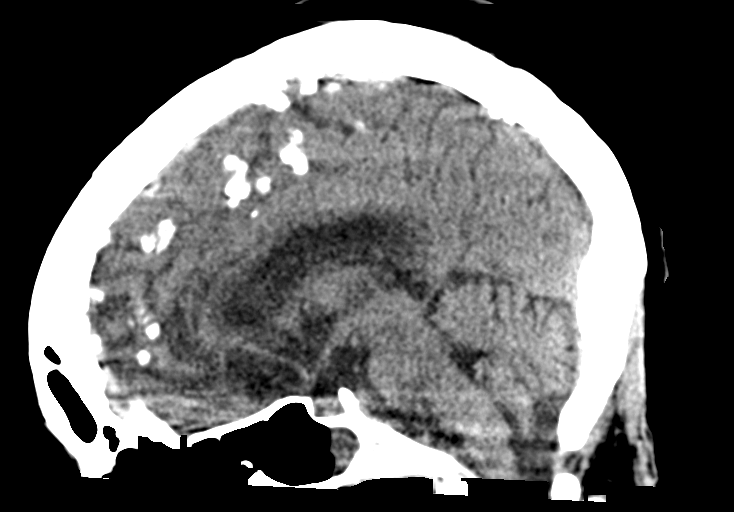
[im 32/50  brain]
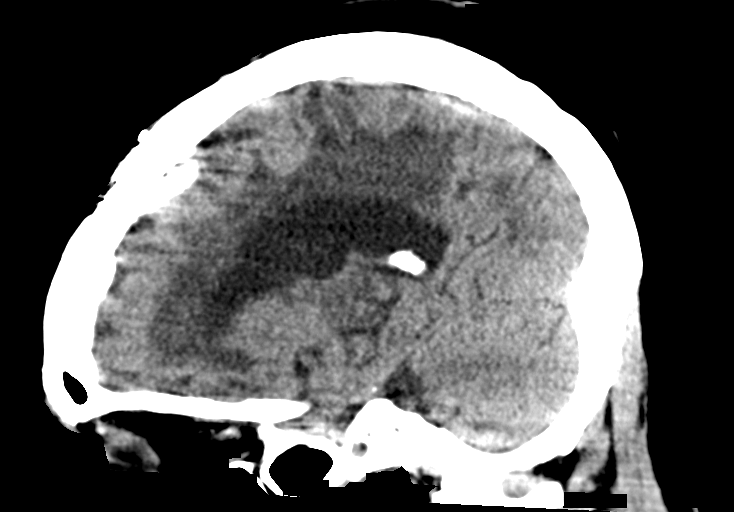

[14 of 47 positions shown; findings below may reference images not displayed]

FINDINGS: Brain: Encephalomalacia throughout most of the left MCA territory
compatible with chronic infarct. This extends into the basal
ganglia. Compensatory enlargement left lateral ventricle. Negative
for hydrocephalus. Mild white matter changes in the right hemisphere

Negative for acute infarct, hemorrhage, mass

Vascular: Negative for hyperdense vessel

Skull: Left-sided craniotomy. Cranioplasty flap is not well attached
to the calvarium and is displaced medially particularly along the
inferior and posterior margins of the craniotomy. Bony resorption of
the craniotomy flap.

Sinuses/Orbits: Air-fluid level in the right sphenoid sinus.
Remaining sinuses clear.

Other: None
IMPRESSION: No prior study. Chronic left MCA infarct. No acute infarct or
hemorrhage

Left craniotomy. Cranioplasty flap is displaced medially with loss
of attachment to the calvarium.

## 2022-01-01 IMAGING — DX DG CHEST 1V PORT
1 series · 1 of 1 positions shown · non-contrast
Comparison: Portable exam 6616 hours compared to 10/30/2020

CLINICAL DATA: Weakness, sepsis, acute kidney injury, history
stroke, former smoker

EXAM:
PORTABLE CHEST 1 VIEW

[chest ap]
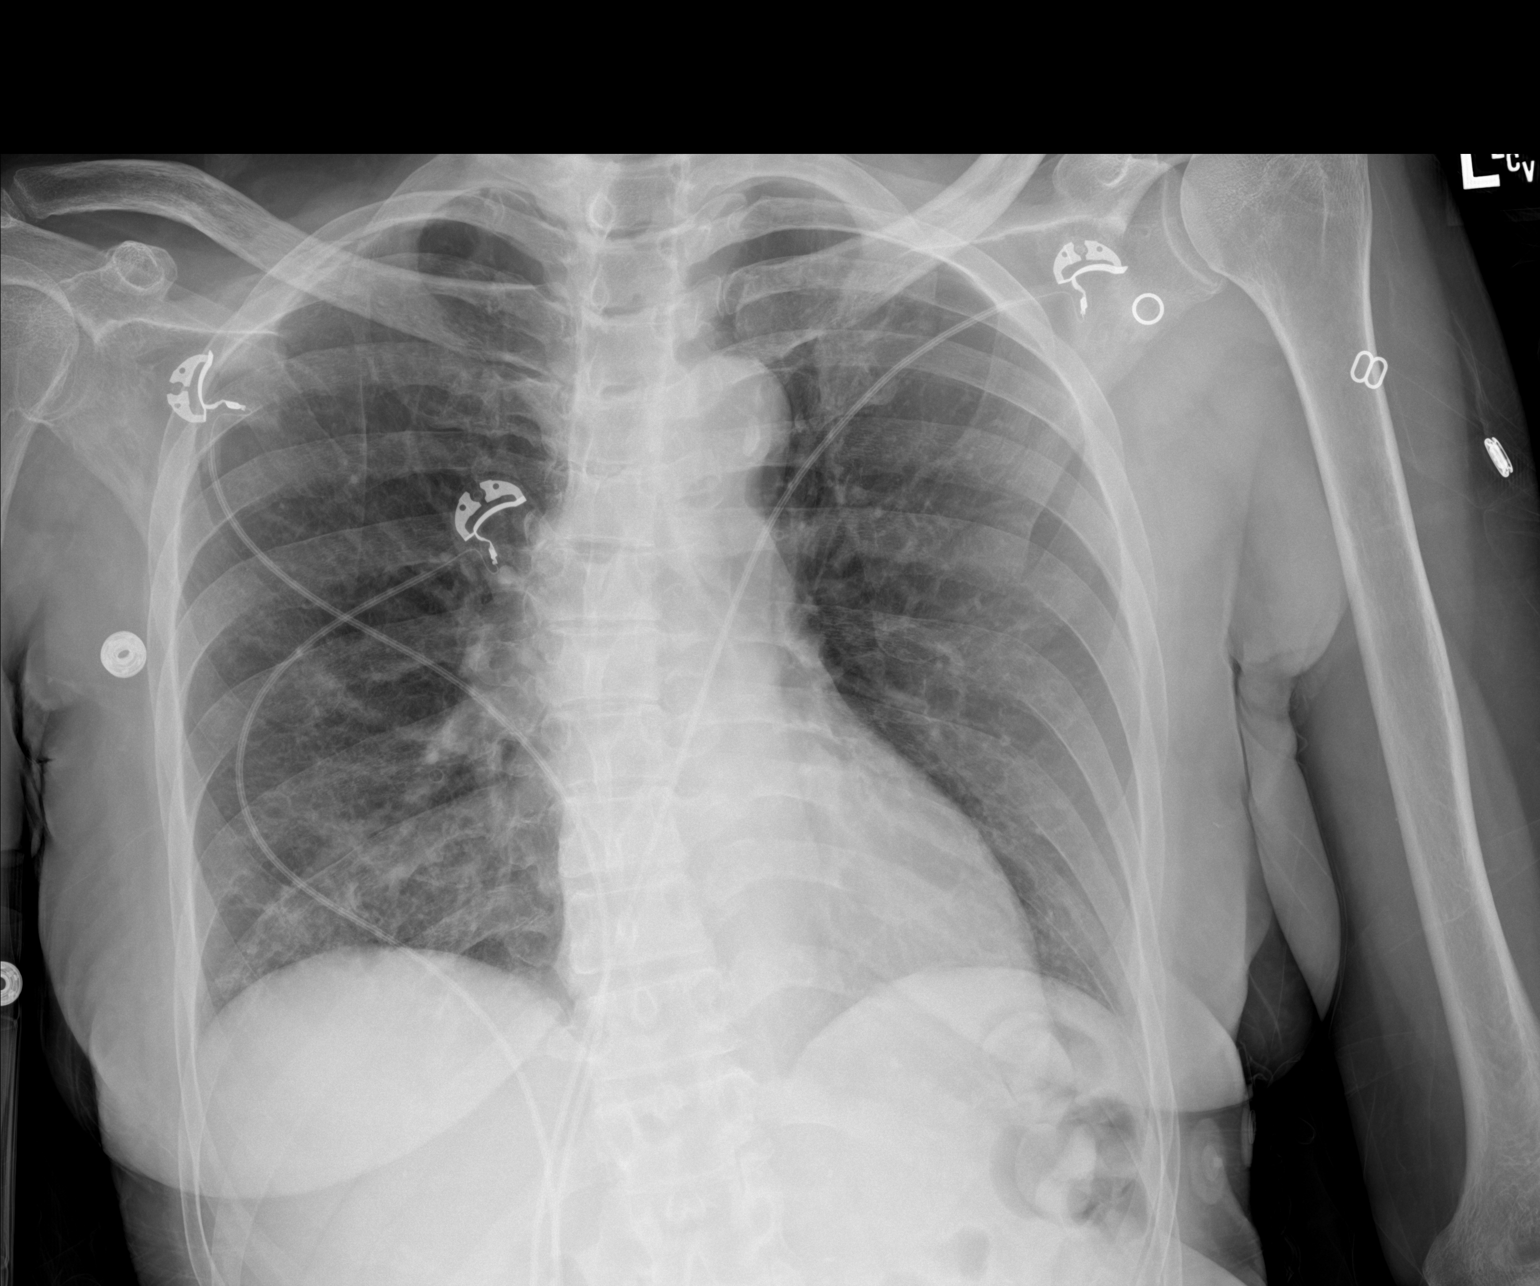

[1 of 1 positions shown; findings below may reference images not displayed]

FINDINGS: Normal heart size, mediastinal contours, and pulmonary vascularity.

Atherosclerotic calcification aorta.

Mild opacity at RIGHT lung base which could represent atelectasis or
infiltrate.

Remaining lungs clear.

No pleural effusion or pneumothorax.

Bones demineralized.
IMPRESSION: New mild opacity at RIGHT lung base question atelectasis versus
infiltrate.

## 2022-03-11 ENCOUNTER — Other Ambulatory Visit: Payer: Self-pay | Admitting: Family Medicine

## 2022-03-11 DIAGNOSIS — I10 Essential (primary) hypertension: Secondary | ICD-10-CM

## 2022-05-23 ENCOUNTER — Ambulatory Visit
Admission: EM | Admit: 2022-05-23 | Discharge: 2022-05-23 | Disposition: A | Discharge status: Home / Self Care | Payer: Medicare Other | Attending: Emergency Medicine | Admitting: Emergency Medicine

## 2022-05-23 ENCOUNTER — Encounter: Payer: Medicare Other | Admitting: Family Medicine

## 2022-05-23 DIAGNOSIS — B372 Candidiasis of skin and nail: Secondary | ICD-10-CM | POA: Diagnosis not present

## 2022-05-23 DIAGNOSIS — L22 Diaper dermatitis: Secondary | ICD-10-CM | POA: Diagnosis not present

## 2022-05-23 DIAGNOSIS — R300 Dysuria: Secondary | ICD-10-CM

## 2022-05-23 LAB — POCT URINALYSIS DIP (MANUAL ENTRY)
Bilirubin, UA: NEGATIVE
Blood, UA: NEGATIVE
Glucose, UA: NEGATIVE mg/dL
Leukocytes, UA: NEGATIVE
Nitrite, UA: NEGATIVE
Protein Ur, POC: >=300 mg/dL — AB
Spec Grav, UA: 1.025 (ref 1.010–1.025)
Urobilinogen, UA: 0.2 E.U./dL
pH, UA: 5.5 (ref 5.0–8.0)

## 2022-05-23 MED ORDER — NYSTATIN 100000 UNIT/GM EX CREA: TOPICAL_CREAM | CUTANEOUS | 0 refills | Status: DC

## 2022-05-23 NOTE — ED Triage Notes (Signed)
Patient to Urgent Care with complaints of painful urination and moisture associated rash. Symptoms started several weeks ago. Patient wears depends.

## 2022-05-23 NOTE — Discharge Instructions (Addendum)
Use the Nystatin cream as directed.  Follow up with your primary care provider if your symptoms are not improving.    

## 2022-05-23 NOTE — ED Provider Notes (Signed)
Renaldo Fiddler    CSN: [id number] Arrival date & time: 05/23/22  1539      History   Chief Complaint Chief Complaint  Patient presents with   Diaper Rash    Pain when urinate - Entered by patient    HPI Andrea [name] is a 66 y.o. female.  Accompanied by her daughter, patient presents with painful rash on her perineum x2 weeks.  The pain is worse when she urinates.  No fever, chills, abdominal pain, flank pain, vaginal discharge, or other symptoms.  Treatment attempted at home with diaper cream.  Her medical history includes hypertension, diabetes, CKD, stroke.  The history is provided by the patient, a relative and medical records.    Past Medical History:  Diagnosis Date   Stroke [facility])     Patient Active Problem List   Diagnosis Date Noted   Stage 3b chronic kidney disease (HCC) 12/20/2021   Bronchitis 08/16/2021   Colon cancer screening declined 06/17/2021   Breast screening declined 06/17/2021   Protein-calorie malnutrition (HCC) 11/10/2020   Debility 10/30/2020   Urinary incontinence due to immobility 10/17/2019   Asthma 01/07/2019   HLD (hyperlipidemia) 01/07/2019   History of stroke 10/01/2018   Aphasia as late effect of stroke 10/01/2018   Right sided weakness 10/01/2018   Essential hypertension 10/01/2018   Type 2 diabetes mellitus with other specified complication (HCC) 10/01/2018   Advanced care planning/counseling discussion 10/01/2018    Past Surgical History:  Procedure Laterality Date   BREAST BIOPSY Left    CESAREAN SECTION     4    CRANIOPLASTY Left 08/08/2017   CRANIOTOMY FOR HEMISPHERECTOMY TOTAL / PARTIAL Left 02/16/2017   PATELLA FRACTURE SURGERY Right    PERCUTANEOUS ENDOSCOPIC GASTROSTOMY (PEG) REMOVAL     TUBAL LIGATION      OB History   No obstetric history on file.      Home Medications    Prior to Admission medications   Medication Sig Start Date End Date Taking? Authorizing Provider  nystatin cream (MYCOSTATIN) Apply  to affected area 2 times daily 05/23/22  Yes Mickie Bail, NP  amLODipine (NORVASC) 10 MG tablet Take 1 tablet (10 mg total) by mouth daily. 06/17/21   [name], MD  aspirin EC 81 MG tablet Take 81 mg by mouth daily.    [provider]  Cholecalciferol 25 MCG (1000 UT) tablet Take 2,000 Units by mouth daily.    [provider]  feeding supplement (ENSURE ENLIVE / ENSURE PLUS) LIQD Take 237 mLs by mouth 3 (three) times daily between meals. 11/04/20   Darlin Priestly, MD  hydrALAZINE (APRESOLINE) 25 MG tablet Take 1 tablet (25 mg total) by mouth 3 (three) times daily. 06/17/21   [name], MD  losartan (COZAAR) 50 MG tablet Take 1 tablet by mouth once daily 03/11/22   [name], MD  metFORMIN (GLUCOPHAGE) 500 MG tablet Take 1 tablet (500 mg total) by mouth daily with breakfast. 06/30/21   [name], MD  metoprolol tartrate (LOPRESSOR) 25 MG tablet Take 1 tablet (25 mg total) by mouth 2 (two) times daily. 06/17/21   [name], MD  Multiple Vitamins-Minerals (CENTRUM SILVER PO) Take 1 tablet by mouth daily.    [provider]  OVER THE COUNTER MEDICATION OMEGA 3 FISH OIL. 2000 MG. 1 DAILY    [provider]  polyethylene glycol (MIRALAX / GLYCOLAX) 17 g packet Take 17 g by mouth 2 (two) times daily  as needed. 11/04/20   Darlin Priestly, MD  pravastatin (PRAVACHOL) 80 MG tablet Take 1 tablet (80 mg total) by mouth daily. 06/17/21   [name], MD  Probiotic Product (PROBIOTIC-10 PO) Take by mouth. 1 tablet daily    [provider]  vitamin C (ASCORBIC ACID) 500 MG tablet Take 500 mg by mouth daily.    [provider]    Family History Family History  Problem Relation Age of Onset   Stroke Mother    Diabetes Mother    Hypertension Mother    Aneurysm Father    Diabetes Sister    Hypertension Sister    Diabetes Sister    Hypertension Sister    Hypertension Sister    Hypertension Sister     Social History Social History    Tobacco Use   Smoking status: Former    Packs/day: 0.25    Years: 30.00    Total pack years: 7.50    Types: Cigarettes    Quit date: 12/2016    Years since quitting: 5.4   Smokeless tobacco: Never  Vaping Use   Vaping Use: Never used  Substance Use Topics   Alcohol use: Never   Drug use: Never     Allergies   Citric acid and Citrus   Review of Systems Review of Systems  Constitutional:  Negative for chills and fever.  Gastrointestinal:  Negative for abdominal pain.  Genitourinary:  Positive for dysuria. Negative for hematuria.  Skin:  Positive for rash. Negative for color change.  All other systems reviewed and are negative.    Physical Exam Triage Vital Signs ED Triage Vitals [05/23/22 1642]  Enc Vitals Group     BP (!) 159/80     Pulse Rate 88     Resp 18     Temp 97.9 F (36.6 C)     Temp src      SpO2 99 %     Weight      Height      Head Circumference      Peak Flow      Pain Score      Pain Loc      Pain Edu?      Excl. in GC?    No data found.  Updated Vital Signs BP (!) 159/80   Pulse 88   Temp 97.9 F (36.6 C)   Resp 18   Ht 5\' 6"  (1.676 m)   SpO2 99%   BMI 22.40 kg/m   Visual Acuity Right Eye Distance:   Left Eye Distance:   Bilateral Distance:    Right Eye Near:   Left Eye Near:    Bilateral Near:     Physical Exam Vitals and nursing note reviewed.  Constitutional:      General: She is not in acute distress.    Appearance: She is well-developed. She is not ill-appearing.  HENT:     Mouth/Throat:     Mouth: Mucous membranes are moist.  Cardiovascular:     Rate and Rhythm: Normal rate and regular rhythm.  Pulmonary:     Effort: Pulmonary effort is normal. No respiratory distress.  Genitourinary:    Vagina: No vaginal discharge.  Musculoskeletal:     Cervical back: Neck supple.  Skin:    General: Skin is warm and dry.     Findings: Rash present.     Comments: Hyperpigmented groin and diaper rash.  No erythema.  No  open wounds.  No vaginal discharge  noted.  Neurological:     Mental Status: She is alert.  Psychiatric:        Mood and Affect: Mood normal.        Behavior: Behavior normal.      UC Treatments / Results  Labs (all labs ordered are listed, but only abnormal results are displayed) Labs Reviewed  POCT URINALYSIS DIP (MANUAL ENTRY) - Abnormal; Notable for the following components:      Result Value   Ketones, POC UA trace (5) (*)    Protein Ur, POC >=300 (*)    All other components within normal limits    EKG   Radiology No results found.  Procedures Procedures (including critical care time)  Medications Ordered in UC Medications - No data to display  Initial Impression / Assessment and Plan / UC Course  I have reviewed the triage vital signs and the nursing notes.  Pertinent labs & imaging results that were available during my care of the patient were reviewed by me and considered in my medical decision making (see chart for details).    Candidal diaper rash, dysuria.  Urine does not show signs of infection.  Treating rash with nystatin cream.  Education provided on skin yeast infection.  Instructed patient if her symptoms are not improving.  They agree to plan of care.   Final Clinical Impressions(s) / UC Diagnoses   Final diagnoses:  Candidal diaper dermatitis  Dysuria     Discharge Instructions      Use the Nystatin cream as directed.  Follow up with your primary care provider if your symptoms are not improving.        ED Prescriptions     Medication Sig Dispense Auth. Provider   nystatin cream (MYCOSTATIN) Apply to affected area 2 times daily 30 g Mickie Bail, NP      PDMP not reviewed this encounter.   Mickie Bail, NP 05/23/22 [phone]

## 2022-06-03 ENCOUNTER — Other Ambulatory Visit: Payer: Self-pay | Admitting: Family Medicine

## 2022-06-03 DIAGNOSIS — I10 Essential (primary) hypertension: Secondary | ICD-10-CM

## 2022-06-12 ENCOUNTER — Other Ambulatory Visit: Payer: Self-pay | Admitting: Family Medicine

## 2022-06-12 DIAGNOSIS — E785 Hyperlipidemia, unspecified: Secondary | ICD-10-CM

## 2022-06-12 DIAGNOSIS — E1169 Type 2 diabetes mellitus with other specified complication: Secondary | ICD-10-CM

## 2022-06-12 DIAGNOSIS — I10 Essential (primary) hypertension: Secondary | ICD-10-CM

## 2022-06-24 ENCOUNTER — Ambulatory Visit (INDEPENDENT_AMBULATORY_CARE_PROVIDER_SITE_OTHER): Payer: Medicare Other | Admitting: Family Medicine

## 2022-06-24 ENCOUNTER — Other Ambulatory Visit (INDEPENDENT_AMBULATORY_CARE_PROVIDER_SITE_OTHER): Payer: Medicare Other

## 2022-06-24 VITALS — BP 130/80 | HR 75 | Temp 97.3°F | Ht 64.0 in | Wt 130.5 lb

## 2022-06-24 DIAGNOSIS — G8190 Hemiplegia, unspecified affecting unspecified side: Secondary | ICD-10-CM

## 2022-06-24 DIAGNOSIS — E1169 Type 2 diabetes mellitus with other specified complication: Secondary | ICD-10-CM

## 2022-06-24 DIAGNOSIS — N1832 Chronic kidney disease, stage 3b: Secondary | ICD-10-CM

## 2022-06-24 DIAGNOSIS — E785 Hyperlipidemia, unspecified: Secondary | ICD-10-CM | POA: Diagnosis not present

## 2022-06-24 DIAGNOSIS — Z0000 Encounter for general adult medical examination without abnormal findings: Secondary | ICD-10-CM

## 2022-06-24 DIAGNOSIS — Z23 Encounter for immunization: Secondary | ICD-10-CM

## 2022-06-24 DIAGNOSIS — I10 Essential (primary) hypertension: Secondary | ICD-10-CM | POA: Diagnosis not present

## 2022-06-24 DIAGNOSIS — E440 Moderate protein-calorie malnutrition: Secondary | ICD-10-CM

## 2022-06-24 LAB — COMPREHENSIVE METABOLIC PANEL WITH GFR
ALT: 20 U/L (ref 0–35)
AST: 20 U/L (ref 0–37)
Albumin: 4.2 g/dL (ref 3.5–5.2)
Alkaline Phosphatase: 70 U/L (ref 39–117)
BUN: 18 mg/dL (ref 6–23)
CO2: 29 meq/L (ref 19–32)
Calcium: 11 mg/dL — ABNORMAL HIGH (ref 8.4–10.5)
Chloride: 103 meq/L (ref 96–112)
Creatinine, Ser: 1.51 mg/dL — ABNORMAL HIGH (ref 0.40–1.20)
GFR: 36.26 mL/min — ABNORMAL LOW (ref >60.00)
Glucose, Bld: 133 mg/dL — ABNORMAL HIGH (ref 70–99)
Potassium: 4.3 meq/L (ref 3.5–5.1)
Sodium: 141 meq/L (ref 135–145)
Total Bilirubin: 0.4 mg/dL (ref 0.2–1.2)
Total Protein: 8.3 g/dL (ref 6.0–8.3)

## 2022-06-24 LAB — CBC
HCT: 33.9 % — ABNORMAL LOW (ref 36.0–46.0)
Hemoglobin: 11 g/dL — ABNORMAL LOW (ref 12.0–15.0)
MCHC: 32.3 g/dL (ref 30.0–36.0)
MCV: 80.1 fl (ref 78.0–100.0)
Platelets: 386 10*3/uL (ref 150.0–400.0)
RBC: 4.23 Mil/uL (ref 3.87–5.11)
RDW: 16.1 % — ABNORMAL HIGH (ref 11.5–15.5)
WBC: 6.9 10*3/uL (ref 4.0–10.5)

## 2022-06-24 LAB — LIPID PANEL
Cholesterol: 154 mg/dL (ref 0–200)
HDL: 55.5 mg/dL (ref >39.00)
LDL Cholesterol: 70 mg/dL (ref 0–99)
NonHDL: 98.49
Total CHOL/HDL Ratio: 3
Triglycerides: 144 mg/dL (ref 0.0–149.0)
VLDL: 28.8 mg/dL (ref 0.0–40.0)

## 2022-06-24 NOTE — Assessment & Plan Note (Signed)
Right-sided.  She continues to progress and is able to do more for herself.  She does the majority of her ADLs though modified she is taking sink baths.

## 2022-06-24 NOTE — Assessment & Plan Note (Signed)
Labs today, continue metformin 500 mg daily.

## 2022-06-24 NOTE — Progress Notes (Signed)
Subjective:   Andrea [name] is a 66 y.o. female who presents for Medicare Annual (Subsequent) preventive examination.  Review of Systems    Review of Systems  Constitutional:  Negative for chills and fever.  HENT:  Negative for congestion and sore throat.   Eyes:  Negative for blurred vision and double vision.  Respiratory:  Negative for shortness of breath.   Cardiovascular:  Negative for chest pain.  Gastrointestinal:  Negative for heartburn, nausea and vomiting.  Genitourinary: Negative.   Musculoskeletal: Negative.  Negative for myalgias.  Skin:  Negative for rash.  Neurological:  Negative for dizziness and headaches.  Endo/Heme/Allergies:  Does not bruise/bleed easily.  Psychiatric/Behavioral:  Negative for depression. The patient is not nervous/anxious.    Home bp 128/79, typically 120-138  Cardiac Risk Factors include: advanced age (>44mn, >>23women);diabetes mellitus;hypertension;sedentary lifestyle     Objective:    Today's Vitals   06/24/22 0924 06/24/22 1010  BP: (!) 142/80 130/80  Pulse: 75   Temp: (!) 97.3 F (36.3 C)   TempSrc: Temporal   SpO2: 99%   Weight: 130 lb 8 oz (59.2 kg)   Height: '5\' 4"'  (1.626 m)    Body mass index is 22.4 kg/m.  Wt Readings from Last 3 Encounters:  06/24/22 130 lb 8 oz (59.2 kg)  12/20/21 138 lb 12.8 oz (63 kg)  06/25/21 130 lb 8 oz (59.2 kg)   Recently had teeth removed - gumming food and taking ensure     06/24/2022    9:39 AM 05/23/2022    5:12 PM 06/19/2021   11:36 AM 10/30/2020   12:27 PM  Advanced Directives  Does Patient Have a Medical Advance Directive? No No Yes No  Type of Advance Directive   [place] Chart?   Yes - validated most recent copy scanned in chart (See row information)   Would patient like information on creating a medical advance directive? Yes (MAU/Ambulatory/Procedural Areas - Information given)   No - Patient declined    Current  Medications (verified) Outpatient Encounter Medications as of 06/24/2022  Medication Sig   amLODipine (NORVASC) 10 MG tablet Take 1 tablet by mouth once daily   aspirin EC 81 MG tablet Take 81 mg by mouth daily.   Cholecalciferol 25 MCG (1000 UT) tablet Take 2,000 Units by mouth daily.   feeding supplement (ENSURE ENLIVE / ENSURE PLUS) LIQD Take 237 mLs by mouth 3 (three) times daily between meals.   losartan (COZAAR) 50 MG tablet Take 1 tablet by mouth once daily   metFORMIN (GLUCOPHAGE) 500 MG tablet Take 1 tablet by mouth once daily with breakfast   metoprolol tartrate (LOPRESSOR) 25 MG tablet Take 1 tablet by mouth twice daily   Multiple Vitamins-Minerals (CENTRUM SILVER PO) Take 1 tablet by mouth daily.   nystatin cream (MYCOSTATIN) Apply to affected area 2 times daily   OVER THE COUNTER MEDICATION OMEGA 3 FISH OIL. 2000 MG. 1 DAILY   polyethylene glycol (MIRALAX / GLYCOLAX) 17 g packet Take 17 g by mouth 2 (two) times daily as needed.   pravastatin (PRAVACHOL) 80 MG tablet Take 1 tablet by mouth once daily   Probiotic Product (PROBIOTIC-10 PO) Take by mouth. 1 tablet daily   vitamin C (ASCORBIC ACID) 500 MG tablet Take 500 mg by mouth daily.   [DISCONTINUED] hydrALAZINE (APRESOLINE) 25 MG tablet Take 1 tablet (25 mg total) by mouth 3 (three) times daily.   No  facility-administered encounter medications on file as of 06/24/2022.    Allergies (verified) Citric acid and Citrus   History: Past Medical History:  Diagnosis Date   Stroke [facility])    Past Surgical History:  Procedure Laterality Date   BREAST BIOPSY Left    CESAREAN SECTION     4    CRANIOPLASTY Left 08/08/2017   CRANIOTOMY FOR HEMISPHERECTOMY TOTAL / PARTIAL Left 02/16/2017   PATELLA FRACTURE SURGERY Right    PERCUTANEOUS ENDOSCOPIC GASTROSTOMY (PEG) REMOVAL     TUBAL LIGATION     Family History  Problem Relation Age of Onset   Stroke Mother    Diabetes Mother    Hypertension Mother    Aneurysm Father     Diabetes Sister    Hypertension Sister    Diabetes Sister    Hypertension Sister    Hypertension Sister    Hypertension Sister    Social History   Socioeconomic History   Marital status: Single    Spouse name: Not on file   Number of children: 4   Years of education: Master's degree   Highest education level: Not on file  Occupational History   Not on file  Tobacco Use   Smoking status: Former    Packs/day: 0.25    Years: 30.00    Total pack years: 7.50    Types: Cigarettes    Quit date: 12/2016    Years since quitting: 5.5   Smokeless tobacco: Never  Vaping Use   Vaping Use: Never used  Substance and Sexual Activity   Alcohol use: Never   Drug use: Never   Sexual activity: Not Currently  Other Topics Concern   Not on file  Social History Narrative   Lives with Sherrian Divers (Daughter), also with [name]'s daughter, and her other daughter Jethro Bastos) and children   Just moved to the area from [place], [place]   Social Determinants of Health   Financial Resource Strain: [place]  (06/19/2021)   Overall Financial Resource Strain (CARDIA)    Difficulty of Paying Living Expenses: Not hard at all  Food Insecurity: No Food Insecurity (06/19/2021)   Hunger Vital Sign    Worried About Running Out of Food in the Last Year: Never true    Ran Out of Food in the Last Year: Never true  Transportation Needs: No Transportation Needs (06/19/2021)   PRAPARE - Hydrologist (Medical): No    Lack of Transportation (Non-Medical): No  Physical Activity: Inactive (06/19/2021)   Exercise Vital Sign    Days of Exercise per Week: 0 days    Minutes of Exercise per Session: 0 min  Stress: No Stress Concern Present (06/19/2021)   [place]    Feeling of Stress : Not at all  Social Connections: Socially Isolated (06/19/2021)   Social Connection and Isolation Panel [NHANES]    Frequency of  Communication with Friends and Family: Once a week    Frequency of Social Gatherings with Friends and Family: Once a week    Attends Religious Services: Never    Marine scientist or Organizations: No    Attends Archivist Meetings: Never    Marital Status: Widowed    Tobacco Counseling Counseling given: Not Answered   Clinical Intake:  Pre-visit preparation completed: No  Pain : No/denies pain     BMI - recorded: 22.4 Nutritional Status: BMI of 19-24  Normal Nutritional Risks: Unintentional weight loss (  recently had teeth pulled, doing ensure, transitioning to dentures) Diabetes: Yes CBG done?: No Did pt. bring in CBG monitor from home?: No  How often do you need to have someone help you when you read instructions, pamphlets, or other written materials from your doctor or pharmacy?: 5 - Always  Diabetic?yes  Interpreter Needed?: No      Activities of Daily Living    06/24/2022    9:40 AM  In your present state of health, do you have any difficulty performing the following activities:  Hearing? 0  Vision? 0  Difficulty concentrating or making decisions? 1  Comment speech deficit - limited testing assessment  Walking or climbing stairs? 1  Dressing or bathing? 1  Doing errands, shopping? 1  Preparing Food and eating ? Y  Using the Toilet? Y  Comment bedside commode  In the past six months, have you accidently leaked urine? Y  Do you have problems with loss of bowel control? Y  Comment overnight incontience  Managing your Medications? Y  Managing your Finances? Y  Housekeeping or managing your Housekeeping? Y    Patient Care Team: Lesleigh Noe, MD as PCP - General (Family Medicine)  Indicate any recent Medical Services you may have received from other than [facility] providers in the past year (date may be approximate).     Assessment:   This is a routine wellness examination for Andrea [name].  Hearing/Vision screen No results found.  Dietary  issues and exercise activities discussed: Current Exercise Habits: Home exercise routine, Type of exercise: walking, Time (Minutes): 10, Frequency (Times/Week): 7, Weekly Exercise (Minutes/Week): 70, Intensity: Mild, Exercise limited by: neurologic condition(s)   Goals Addressed             This Visit's Progress    Patient Stated       Family would like to get her to do more activity      Depression Screen    06/24/2022   10:08 AM 06/19/2021   11:20 AM 08/31/2020    2:37 PM  PHQ 2/9 Scores  PHQ - 2 Score 0 0 0    Fall Risk    06/24/2022    9:28 AM 06/19/2021   11:20 AM  [place] in the past year? 0 0  Number falls in past yr: 0 0  Injury with Fall?  0  Risk for fall due to : Impaired balance/gait;Impaired mobility Impaired balance/gait;Other (Comment);Orthopedic patient    FALL RISK PREVENTION PERTAINING TO THE HOME:  Any stairs in or around the home? Yes  If so, are there any without handrails? Yes  Home free of loose throw rugs in walkways, pet beds, electrical cords, etc? Yes  Adequate lighting in your home to reduce risk of falls? Yes   ASSISTIVE DEVICES UTILIZED TO PREVENT FALLS:  Life alert? No  Use of a cane, walker or w/c? Yes  Grab bars in the bathroom? No  Shower chair or bench in shower? No  Elevated toilet seat or a handicapped toilet? Yes   Patient takes sink baths   Cognitive Function: pt with aphasia - unable to assess - guardian notes improvement in memory and communciation         06/19/2021   11:21 AM  6CIT Screen  What Year? 0 points  What month? 0 points  What time? 3 points  Count back from 20 4 points  Months in reverse 4 points  Repeat phrase 10 points  Total Score 21 points  Immunizations Immunization History  Administered Date(s) Administered   Influenza, Quadrivalent, Recombinant, Inj, Pf 08/10/2017   Influenza,inj,Quad PF,6+ Mos 11/10/2020, 06/24/2022   MMR 04/22/1996, 03/24/1998   Pneumococcal  Polysaccharide-23 11/10/2020   Tdap 05/12/2016   Zoster, Live 05/14/2016    TDAP status: Up to date  Flu Vaccine status: Up to date  Pneumococcal vaccine status: Up to date  Covid-19 vaccine status: Declined, Education has been provided regarding the importance of this vaccine but patient still declined. Advised may receive this vaccine at local pharmacy or Health Dept.or vaccine clinic. Aware to provide a copy of the vaccination record if obtained from local pharmacy or Health Dept. Verbalized acceptance and understanding.  Qualifies for Shingles Vaccine? Yes   Zostavax completed Yes   Shingrix Completed?: No.    Education has been provided regarding the importance of this vaccine. Patient has been advised to call insurance company to determine out of pocket expense if they have not yet received this vaccine. Advised may also receive vaccine at local pharmacy or Health Dept. Verbalized acceptance and understanding.  Screening Tests Health Maintenance  Topic Date Due   Diabetic kidney evaluation - Urine ACR  Never done   OPHTHALMOLOGY EXAM  01/16/2021   HEMOGLOBIN A1C  06/22/2022   COVID-19 Vaccine (1) 07/19/2022 (Originally 04/10/1956)   Zoster Vaccines- Shingrix (1 of 2) 09/19/2022 (Originally 10/12/2007)   MAMMOGRAM  06/25/2023 (Originally 10/12/2007)   COLONOSCOPY (Pts 45-42yr Insurance coverage will need to be confirmed)  06/25/2023 (Originally 10/11/2002)   Diabetic kidney evaluation - GFR measurement  12/28/2022   FOOT EXAM  06/25/2023   TETANUS/TDAP  05/12/2026   INFLUENZA VACCINE  Completed   Hepatitis C Screening  Completed   HIV Screening  Completed   HPV VACCINES  Aged Out   PAP SMEAR-Modifier  Discontinued    Health Maintenance  Health Maintenance Due  Topic Date Due   Diabetic kidney evaluation - Urine ACR  Never done   OPHTHALMOLOGY EXAM  01/16/2021   HEMOGLOBIN A1C  06/22/2022    Declined Colon Cancer Screening  Declined Breast cancer screening  Bone density  - next year  Social History   Tobacco Use  Smoking Status Former   Packs/day: 0.25   Years: 30.00   Total pack years: 7.50   Types: Cigarettes   Quit date: 12/2016   Years since quitting: 5.5  Smokeless Tobacco Never     Lung Cancer Screening: (Low Dose CT Chest recommended if Age 64-80years, 30 pack-year currently smoking OR have quit w/in 15years.) does not qualify.   Lung Cancer Screening Referral: n/a  Additional Screening:  Hepatitis C Screening: does qualify; Completed 2022  Vision Screening: Recommended annual ophthalmology exams for early detection of glaucoma and other disorders of the eye. Is the patient up to date with their annual eye exam?  No  Who is the provider or what is the name of the office in which the patient attends annual eye exams? [facility]   Dental Screening: Recommended annual dental exams for proper oral hygiene  Community Resource Referral / Chronic Care Management: CRR required this visit?  No   CCM required this visit?  No      Plan:    Problem List Items Addressed This Visit       Cardiovascular and Mediastinum   Essential hypertension    Slightly elevated, home monitoring.  Continue losartan 50 mg and amlodipine 10 mg.      Relevant Orders   Comprehensive metabolic panel  CBC     Endocrine   Type 2 diabetes mellitus with other specified complication ([place])    Labs today, continue metformin 500 mg daily.      Relevant Orders   Hemoglobin A1c   Microalbumin / creatinine urine ratio     Nervous and Auditory   Hemiparesis, unspecified hemiparesis etiology, unspecified laterality (HCC)    Right-sided.  She continues to progress and is able to do more for herself.  She does the majority of her ADLs though modified she is taking sink baths.        Genitourinary   Stage 3b chronic kidney disease ([place])   Relevant Orders   Comprehensive metabolic panel   CBC     Other   HLD (hyperlipidemia)   Relevant Orders    Lipid panel   Protein-calorie malnutrition ([place])    Stable, she is transitioning to dentures and has had less p.o. intake.  Continue to monitor.      Other Visit Diagnoses     Need for influenza vaccination    -  Primary   Relevant Orders   Flu Vaccine QUAD 66moIM (Fluarix, Fluzone & Alfiuria Quad PF) (Completed)   Encounter for Medicare annual wellness exam           I have personally reviewed and noted the following in the patient's chart:   Medical and social history Use of alcohol, tobacco or illicit drugs  Current medications and supplements including opioid prescriptions. Patient is not currently taking opioid prescriptions. Functional ability and status Nutritional status Physical activity Advanced directives List of other physicians Hospitalizations, surgeries, and ER visits in previous 12 months Vitals Screenings to include cognitive, depression, and falls Referrals and appointments  In addition, I have reviewed and discussed with patient certain preventive protocols, quality metrics, and best practice recommendations. A written personalized care plan for preventive services as well as general preventive health recommendations were provided to patient.     JLesleigh Noe MD   06/24/2022

## 2022-06-24 NOTE — Addendum Note (Signed)
Addended by: Ellamae Sia on: 06/24/2022 03:17 PM   Modules accepted: Orders

## 2022-06-24 NOTE — Assessment & Plan Note (Signed)
Slightly elevated, home monitoring.  Continue losartan 50 mg and amlodipine 10 mg.

## 2022-06-24 NOTE — Assessment & Plan Note (Signed)
Stable, she is transitioning to dentures and has had less p.o. intake.  Continue to monitor.

## 2022-06-24 NOTE — Patient Instructions (Signed)
Monitor blood pressure - ideally less than 130/90 - Continue losartan and amlodipine

## 2022-06-25 LAB — MICROALBUMIN / CREATININE URINE RATIO
Creatinine, Urine: 64 mg/dL (ref 20–275)
Microalb Creat Ratio: 1616 mcg/mg creat — ABNORMAL HIGH (ref <30)
Microalb, Ur: 103.4 mg/dL

## 2022-06-27 LAB — HEMOGLOBIN A1C: Hgb A1c MFr Bld: 6.5 % (ref 4.6–6.5)

## 2022-06-29 ENCOUNTER — Encounter: Payer: Self-pay | Admitting: Family Medicine

## 2022-06-29 DIAGNOSIS — N1832 Chronic kidney disease, stage 3b: Secondary | ICD-10-CM

## 2022-06-29 DIAGNOSIS — E1169 Type 2 diabetes mellitus with other specified complication: Secondary | ICD-10-CM

## 2022-10-04 ENCOUNTER — Telehealth: Payer: Self-pay | Admitting: Family Medicine

## 2022-10-04 DIAGNOSIS — E1169 Type 2 diabetes mellitus with other specified complication: Secondary | ICD-10-CM

## 2022-10-04 DIAGNOSIS — I10 Essential (primary) hypertension: Secondary | ICD-10-CM

## 2022-10-04 MED ORDER — LOSARTAN POTASSIUM 50 MG PO TABS: 50.0000 mg | ORAL_TABLET | Freq: Every day | ORAL | 0 refills | Status: DC

## 2022-10-04 MED ORDER — METFORMIN HCL 500 MG PO TABS: 500.0000 mg | ORAL_TABLET | Freq: Every day | ORAL | 0 refills | Status: DC

## 2022-10-04 NOTE — Telephone Encounter (Signed)
  Encourage patient to contact the pharmacy for refills or they can request refills through [facility]  Did the patient contact the pharmacy:     LAST APPOINTMENT DATE:06/24/22  NEXT APPOINTMENT DATE:12/23/22 TOC  MEDICATION:losartan (COZAAR) 50 MG tablet  metFORMIN (GLUCOPHAGE) 500 MG tablet  Is the patient out of medication? yes  If not, how much is left?none  Is this a 90 day supply: 90  PHARMACY: [place], [place] Phone: [phone]  Fax: [phone]     Let patient know to contact pharmacy at the end of the day to make sure medication is ready.  Please notify patient to allow 48-72 hours to process

## 2022-10-24 ENCOUNTER — Other Ambulatory Visit: Payer: Self-pay | Admitting: Family Medicine

## 2022-10-24 DIAGNOSIS — E785 Hyperlipidemia, unspecified: Secondary | ICD-10-CM

## 2022-10-24 DIAGNOSIS — I10 Essential (primary) hypertension: Secondary | ICD-10-CM

## 2022-10-24 NOTE — Telephone Encounter (Signed)
Last office visit 06/24/2022 for [place] with Dr. Einar Pheasant.  Last refilled Metoprolol 06/14/22 for #180 with no refills.  Amlodipine 06/14/22 for #90 with no refills.  Pravastatin #90 with no refills.  Patient has TOC with Dr. Diona Browner on 12/23/22.  Ok to refill?

## 2022-10-24 NOTE — Telephone Encounter (Signed)
Prescription Request  10/24/2022  Is this a "Controlled Substance" medicine? No  LOV: 06/24/2022  What is the name of the medication or equipment? metoprolol tartrate (LOPRESSOR) 25 MG tablet  amLODipine (NORVASC) 10 MG tablet  pravastatin (PRAVACHOL) 80 MG tablet  Have you contacted your pharmacy to request a refill? Yes   Which pharmacy would you like this sent to?  [place], [place] - [address] [place] [address] Phone: [phone] Fax: [phone]    Patient notified that their request is being sent to the clinical staff for review and that they should receive a response within 2 business days.   Please advise at Mobile [phone] (mobile)

## 2022-10-25 MED ORDER — AMLODIPINE BESYLATE 10 MG PO TABS: 10.0000 mg | ORAL_TABLET | Freq: Every day | ORAL | 0 refills | Status: DC

## 2022-10-25 MED ORDER — PRAVASTATIN SODIUM 80 MG PO TABS: 80.0000 mg | ORAL_TABLET | Freq: Every day | ORAL | 0 refills | Status: DC

## 2022-10-25 MED ORDER — METOPROLOL TARTRATE 25 MG PO TABS: 25.0000 mg | ORAL_TABLET | Freq: Two times a day (BID) | ORAL | 0 refills | Status: DC

## 2022-11-01 ENCOUNTER — Telehealth: Payer: Self-pay | Admitting: Family Medicine

## 2022-11-01 NOTE — Telephone Encounter (Signed)
Zetta Bills, pt's daughter, dropped off FMLA ppw to be completed. Lydia Guiles stated there's a fax # on ppw, [phone]. Rahseeda requested for ppw to be faxed to that # once comp. Call back # [phone]

## 2022-11-01 NOTE — Telephone Encounter (Signed)
FMLA paperwork was dropped off for patient by her daughter, Darrelyn Hillock.  Patient previously saw Dr Einar Pheasant and has a transfer of care appt with you on 11/04/22 to discuss FMLA paperwork.  Paperwork placed in your inbox for completion and signing.  Paperwork due on 11/10/22.

## 2022-11-01 NOTE — Telephone Encounter (Signed)
Noted  

## 2022-11-04 ENCOUNTER — Encounter: Payer: Self-pay | Admitting: Family Medicine

## 2022-11-04 ENCOUNTER — Ambulatory Visit (INDEPENDENT_AMBULATORY_CARE_PROVIDER_SITE_OTHER): Payer: Medicare Other | Admitting: Family Medicine

## 2022-11-04 VITALS — BP 150/80 | HR 79 | Temp 97.1°F | Ht 66.0 in | Wt 138.2 lb

## 2022-11-04 DIAGNOSIS — E785 Hyperlipidemia, unspecified: Secondary | ICD-10-CM | POA: Diagnosis not present

## 2022-11-04 DIAGNOSIS — G8190 Hemiplegia, unspecified affecting unspecified side: Secondary | ICD-10-CM | POA: Diagnosis not present

## 2022-11-04 DIAGNOSIS — I10 Essential (primary) hypertension: Secondary | ICD-10-CM | POA: Diagnosis not present

## 2022-11-04 DIAGNOSIS — R3981 Functional urinary incontinence: Secondary | ICD-10-CM

## 2022-11-04 DIAGNOSIS — Z8673 Personal history of transient ischemic attack (TIA), and cerebral infarction without residual deficits: Secondary | ICD-10-CM

## 2022-11-04 DIAGNOSIS — E1169 Type 2 diabetes mellitus with other specified complication: Secondary | ICD-10-CM | POA: Diagnosis not present

## 2022-11-04 DIAGNOSIS — N1832 Chronic kidney disease, stage 3b: Secondary | ICD-10-CM

## 2022-11-04 NOTE — Assessment & Plan Note (Addendum)
Ischemic.  Followed by Neurology Dr. Melrose Nakayama.

## 2022-11-04 NOTE — Assessment & Plan Note (Signed)
Stable, chronic.  Continue current medication.  Pravastatin 80 mg daily. 

## 2022-11-04 NOTE — Assessment & Plan Note (Signed)
Stable, chronic.  Continue current medication.  Well-controlled on metformin 500 mg daily

## 2022-11-04 NOTE — Assessment & Plan Note (Signed)
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

## 2022-11-04 NOTE — Assessment & Plan Note (Signed)
Chronic  Not at goal in office today.  On amlodipine 10 mg p.o. dail, losartan 50 mg p.o. daily, Lopressor 25 mg p.o. twice daily

## 2022-11-04 NOTE — Assessment & Plan Note (Signed)
Chronic-stable.

## 2022-11-04 NOTE — Assessment & Plan Note (Signed)
Followed by urology.   

## 2022-11-04 NOTE — Progress Notes (Signed)
Patient ID: Andrea [name], female    DOB: 11/02/1955, 67 y.o.   MRN: [id number]  This visit was conducted in person.  BP (!) 150/80   Pulse 79   Temp (!) 97.1 F (36.2 C) (Temporal)   Ht 5\' 6"  (1.676 m)   Wt 138 lb 4 oz (62.7 kg)   SpO2 97%   BMI 22.31 kg/m    CC:  Chief Complaint  Patient presents with   Establish Care    Subjective:   HPI: Andrea [name] is a 67 y.o. female presenting on 11/04/2022 for Establish Care  Previous PCP: COD  Last CPX/AMW: 06/2022  She is here with her daughters... lives with both of them.  Andrea [name]...  primary guardian Andrea Hillock  Ms. 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  Share care of mother. Mom  needs  help preparing food, she is able to feed herslef and Nees a little help dressing and toileting.  Some issues with chewing at times. Needs med administration  Can walk on short distances with quad carne, otherwise in wheelchair.  Uses bedside commode. Uses depends and bed pad for bladder incontinence.   Followed by Neurology Dr. Melrose Nakayama.   Hypertension:  Not at goal in office today.  On amlodipine 10 mg p.o. dail, losartan 50 mg p.o. daily, Lopressor 25 mg p.o. twice daily... she was out of mediaiton for a little while BP Readings from Last 3 Encounters:  11/04/22 (!) 150/80  06/24/22 130/80  05/23/22 (!) 159/80  Using medication without problems or lightheadedness:  none Chest pain with exertion: none Edema: some in right leg.. they use compression pump. Short of breath: none Average home BPs: 124-130/69-80 Other issues:    Diabetes: Well-controlled on metformin 500 mg daily, associated with CKD Lab Results  Component Value Date   HGBA1C 6.5 06/24/2022  Using medications without difficulties: Hypoglycemic episodes: Hyperglycemic episodes: Feet problems: Blood Sugars averaging: eye exam within last year:  Elevated  Cholesterol: LDL at goal on pravastatin 80 mg daily, history of stroke goal LDL less than 70 Lab Results  Component Value Date   CHOL 154 06/24/2022   HDL 55.50 06/24/2022   LDLCALC 70 06/24/2022   TRIG 144.0 06/24/2022   CHOLHDL 3 06/24/2022  Using medications without problems: Muscle aches:  Diet compliance: Exercise: Other complaints:  Wt Readings from Last 3 Encounters:  11/04/22 138 lb 4 oz (62.7 kg)  06/24/22 130 lb 8 oz (59.2 kg)  12/20/21 138 lb 12.8 oz (63 kg)  Body mass index is 22.31 kg/m.    Relevant past medical, surgical, family and social history reviewed and updated as indicated. Interim medical history since our last visit reviewed. Allergies and medications reviewed and updated. Outpatient Medications Prior to Visit  Medication Sig Dispense Refill   amLODipine (NORVASC) 10 MG tablet Take 1 tablet (10 mg total) by mouth daily. 90 tablet 0   aspirin EC 81 MG tablet Take 81 mg by mouth daily.     Cholecalciferol 25 MCG (1000 UT) tablet Take 2,000 Units by mouth daily.     feeding supplement (ENSURE ENLIVE / ENSURE PLUS) LIQD Take 237 mLs by mouth 3 (three) times daily between meals. 237 mL 12   losartan (COZAAR) 50 MG tablet Take 1 tablet (50 mg total) by mouth daily. 90 tablet 0   metFORMIN (GLUCOPHAGE) 500 MG tablet Take 1 tablet (500 mg total) by mouth daily  with breakfast. 90 tablet 0   metoprolol tartrate (LOPRESSOR) 25 MG tablet Take 1 tablet (25 mg total) by mouth 2 (two) times daily. 180 tablet 0   Multiple Vitamins-Minerals (CENTRUM SILVER PO) Take 1 tablet by mouth daily.     nystatin cream (MYCOSTATIN) Apply to affected area 2 times daily 30 g 0   OVER THE COUNTER MEDICATION OMEGA 3 FISH OIL. 2000 MG. 1 DAILY     polyethylene glycol (MIRALAX / GLYCOLAX) 17 g packet Take 17 g by mouth 2 (two) times daily as needed. 14 each 0   pravastatin (PRAVACHOL) 80 MG tablet Take 1 tablet (80 mg total) by mouth daily. 90 tablet 0   Probiotic Product (PROBIOTIC-10  PO) Take by mouth. 1 tablet daily     vitamin C (ASCORBIC ACID) 500 MG tablet Take 500 mg by mouth daily.     No facility-administered medications prior to visit.     Per HPI unless specifically indicated in ROS section below Review of Systems  Constitutional:  Negative for fatigue and fever.  HENT:  Negative for congestion.   Eyes:  Negative for pain.  Respiratory:  Negative for cough and shortness of breath.   Cardiovascular:  Negative for chest pain, palpitations and leg swelling.  Gastrointestinal:  Negative for abdominal pain.  Genitourinary:  Negative for dysuria and vaginal bleeding.  Musculoskeletal:  Negative for back pain.  Neurological:  Negative for syncope, light-headedness and headaches.  Psychiatric/Behavioral:  Negative for dysphoric mood.    Objective:  BP (!) 150/80   Pulse 79   Temp (!) 97.1 F (36.2 C) (Temporal)   Ht 5\' 6"  (1.676 m)   Wt 138 lb 4 oz (62.7 kg)   SpO2 97%   BMI 22.31 kg/m   Wt Readings from Last 3 Encounters:  11/04/22 138 lb 4 oz (62.7 kg)  06/24/22 130 lb 8 oz (59.2 kg)  12/20/21 138 lb 12.8 oz (63 kg)      Physical Exam Constitutional:      General: She is awake. She is not in acute distress.    Appearance: Normal appearance. She is well-developed. She is not ill-appearing or toxic-appearing.  HENT:     Head: Normocephalic.     Right Ear: Hearing, tympanic membrane, ear canal and external ear normal. Tympanic membrane is not erythematous, retracted or bulging.     Left Ear: Hearing, tympanic membrane, ear canal and external ear normal. Tympanic membrane is not erythematous, retracted or bulging.     Nose: No mucosal edema or rhinorrhea.     Right Sinus: No maxillary sinus tenderness or frontal sinus tenderness.     Left Sinus: No maxillary sinus tenderness or frontal sinus tenderness.     Mouth/Throat:     Pharynx: Uvula midline.  Eyes:     General: Lids are normal. Lids are everted, no foreign bodies appreciated.      Conjunctiva/sclera: Conjunctivae normal.     Pupils: Pupils are equal, round, and reactive to light.  Neck:     Thyroid: No thyroid mass or thyromegaly.     Vascular: No carotid bruit.     Trachea: Trachea normal.  Cardiovascular:     Rate and Rhythm: Normal rate and regular rhythm.     Pulses: Normal pulses.     Heart sounds: Normal heart sounds, S1 normal and S2 normal. No murmur heard.    No friction rub. No gallop.  Pulmonary:     Effort: Pulmonary effort is normal. No tachypnea or  respiratory distress.     Breath sounds: Normal breath sounds. No decreased breath sounds, wheezing, rhonchi or rales.  Abdominal:     General: Bowel sounds are normal.     Palpations: Abdomen is soft.     Tenderness: There is no abdominal tenderness.  Musculoskeletal:     Cervical back: Normal range of motion and neck supple.  Skin:    General: Skin is warm and dry.     Findings: No rash.  Neurological:     Mental Status: She is alert. Mental status is at baseline.     Cranial Nerves: Cranial nerves 2-12 are intact.     Sensory: Sensation is intact.     Motor: Weakness, atrophy and abnormal muscle tone present.     Coordination: Coordination abnormal.     Gait: Gait abnormal.     Comments: Nonverbal Right upper extremity paresis, right lower extremity 1/5 strength, except 0/5 in right fott flexion( has brace for foot drop)  Psychiatric:        Mood and Affect: Mood is not anxious or depressed.        Speech: Speech normal.        Behavior: Behavior normal. Behavior is cooperative.        Thought Content: Thought content normal.        Judgment: Judgment normal.       Results for orders placed or performed in visit on 06/24/22  Hemoglobin A1c  Result Value Ref Range   Hgb A1c MFr Bld 6.5 4.6 - 6.5 %    Assessment and Plan  Essential hypertension Assessment & Plan: Chronic  Not at goal in office today.  On amlodipine 10 mg p.o. dail, losartan 50 mg p.o. daily, Lopressor 25 mg p.o.  twice daily   Type 2 diabetes mellitus with other specified complication, without long-term current use of insulin (HCC) Assessment & Plan: Stable, chronic.  Continue current medication.  Well-controlled on metformin 500 mg daily   Hyperlipidemia, unspecified hyperlipidemia type Assessment & Plan: Stable, chronic.  Continue current medication.  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   History of stroke Assessment & Plan:  Ischemic.  Followed by Neurology Dr. Melrose Nakayama.   Stage 3b chronic kidney disease ([place]) Assessment & Plan:  Chronic stable   Urinary incontinence due to immobility Assessment & Plan:  Followed by urology.     Return in about 3 months (around 02/02/2023) for diabetes follow up with fasting labs prior.   Eliezer Lofts, MD

## 2022-11-04 NOTE — Telephone Encounter (Signed)
Patients daughter called in after being seen today regarding FMLA paperwork,and she wanted to make sure the paperwork is completed correctly before sending in  :section  b3, #5 hours or days, make sure to put initials at bottom:circle hour please. And to make sure right hand corner of both pages are initialed.

## 2022-11-04 NOTE — Telephone Encounter (Signed)
Form faxed to fax# [phone].  Received fax confirmation.  [place] made copies for patient during appt and scanning.

## 2022-11-07 ENCOUNTER — Encounter: Payer: Self-pay | Admitting: Family Medicine

## 2022-11-07 NOTE — Telephone Encounter (Signed)
error 

## 2022-11-08 ENCOUNTER — Telehealth: Payer: Self-pay

## 2022-11-08 DIAGNOSIS — Z0279 Encounter for issue of other medical certificate: Secondary | ICD-10-CM

## 2022-11-08 NOTE — Telephone Encounter (Signed)
We received an FMLA form for patient's daughter, Jasper Loser.  Please see note attached to form.  Form placed in your inbox for completion and signing.  Due date 11/24/22.

## 2022-11-10 NOTE — Telephone Encounter (Signed)
Has only form completed.  I talked with Ms. Dawkins regarding her request for the how the form should be filled out. While talking with her she requested that a copy be sent to her through MyChart if possible.  Please go ahead and fax the original to the fax number on the document. Thanks [employer]

## 2022-11-10 NOTE — Telephone Encounter (Signed)
Completed form faxed to fax#[phone].  Received fax confirmation.  Copies made for billing, scanning, and myself.  Sent mychart msg to see if daughter, Jasper Loser, would like a copy sent to her email.

## 2022-11-11 NOTE — Telephone Encounter (Signed)
FMLA form sent to Abran Richard' email address, per request.  [email or web address]

## 2022-12-18 ENCOUNTER — Other Ambulatory Visit: Payer: Self-pay | Admitting: Family

## 2022-12-18 DIAGNOSIS — E1169 Type 2 diabetes mellitus with other specified complication: Secondary | ICD-10-CM

## 2022-12-18 DIAGNOSIS — I10 Essential (primary) hypertension: Secondary | ICD-10-CM

## 2022-12-23 ENCOUNTER — Ambulatory Visit: Payer: Medicare Other | Admitting: Family Medicine

## 2022-12-28 NOTE — Telephone Encounter (Signed)
Looks like refill requests were sent to [facility] and not Dr. Diona Browner so that is why there has been delay.  Refills have been sent to [facility].  [facility] notified by telephone that refills have been sent to the pharmacy.

## 2022-12-28 NOTE — Telephone Encounter (Signed)
Patient daughter Sherrian Divers called in to follow up on this refill request. Stated that she has been waiting a while for them to be put in. Thank you!

## 2023-01-02 ENCOUNTER — Other Ambulatory Visit: Payer: Self-pay | Admitting: Family Medicine

## 2023-01-02 DIAGNOSIS — I10 Essential (primary) hypertension: Secondary | ICD-10-CM

## 2023-01-04 ENCOUNTER — Telehealth: Payer: Self-pay | Admitting: *Deleted

## 2023-01-04 DIAGNOSIS — D649 Anemia, unspecified: Secondary | ICD-10-CM

## 2023-01-04 DIAGNOSIS — E785 Hyperlipidemia, unspecified: Secondary | ICD-10-CM

## 2023-01-04 DIAGNOSIS — E1169 Type 2 diabetes mellitus with other specified complication: Secondary | ICD-10-CM

## 2023-01-04 NOTE — Telephone Encounter (Signed)
-----   Message from Ellamae Sia sent at 01/04/2023 11:08 AM EDT ----- Regarding: Lab orders for Friday, 4.26.24 Lab orders for a 3 month follow up appt.

## 2023-01-05 NOTE — Addendum Note (Signed)
Addended by: Eliezer Lofts E on: 01/05/2023 01:56 PM   Modules accepted: Orders

## 2023-01-27 ENCOUNTER — Other Ambulatory Visit (INDEPENDENT_AMBULATORY_CARE_PROVIDER_SITE_OTHER): Payer: Medicare Other

## 2023-01-27 DIAGNOSIS — D649 Anemia, unspecified: Secondary | ICD-10-CM

## 2023-01-27 DIAGNOSIS — E785 Hyperlipidemia, unspecified: Secondary | ICD-10-CM

## 2023-01-27 DIAGNOSIS — E1169 Type 2 diabetes mellitus with other specified complication: Secondary | ICD-10-CM | POA: Diagnosis not present

## 2023-01-27 LAB — LIPID PANEL
Cholesterol: 171 mg/dL (ref 0–200)
HDL: 64.9 mg/dL (ref >39.00)
LDL Cholesterol: 77 mg/dL (ref 0–99)
NonHDL: 105.61
Total CHOL/HDL Ratio: 3
Triglycerides: 141 mg/dL (ref 0.0–149.0)
VLDL: 28.2 mg/dL (ref 0.0–40.0)

## 2023-01-27 LAB — HEMOGLOBIN A1C: Hgb A1c MFr Bld: 7 % — ABNORMAL HIGH (ref 4.6–6.5)

## 2023-01-27 LAB — CBC WITH DIFFERENTIAL/PLATELET
Basophils Absolute: 0.1 10*3/uL (ref 0.0–0.1)
Basophils Relative: 0.5 % (ref 0.0–3.0)
Eosinophils Absolute: 0.4 10*3/uL (ref 0.0–0.7)
Eosinophils Relative: 3.9 % (ref 0.0–5.0)
HCT: 37.1 % (ref 36.0–46.0)
Hemoglobin: 12.4 g/dL (ref 12.0–15.0)
Lymphocytes Relative: 36.2 % (ref 12.0–46.0)
Lymphs Abs: 3.5 10*3/uL (ref 0.7–4.0)
MCHC: 33.3 g/dL (ref 30.0–36.0)
MCV: 82.9 fl (ref 78.0–100.0)
Monocytes Absolute: 0.6 10*3/uL (ref 0.1–1.0)
Monocytes Relative: 6.5 % (ref 3.0–12.0)
Neutro Abs: 5.1 10*3/uL (ref 1.4–7.7)
Neutrophils Relative %: 52.9 % (ref 43.0–77.0)
Platelets: 369 10*3/uL (ref 150.0–400.0)
RBC: 4.48 Mil/uL (ref 3.87–5.11)
RDW: 15 % (ref 11.5–15.5)
WBC: 9.6 10*3/uL (ref 4.0–10.5)

## 2023-01-27 LAB — COMPREHENSIVE METABOLIC PANEL
ALT: 14 U/L (ref 0–35)
AST: 16 U/L (ref 0–37)
Albumin: 4.2 g/dL (ref 3.5–5.2)
Alkaline Phosphatase: 67 U/L (ref 39–117)
BUN: 20 mg/dL (ref 6–23)
CO2: 27 mEq/L (ref 19–32)
Calcium: 10.1 mg/dL (ref 8.4–10.5)
Chloride: 105 mEq/L (ref 96–112)
Creatinine, Ser: 1.79 mg/dL — ABNORMAL HIGH (ref 0.40–1.20)
GFR: 29.44 mL/min — ABNORMAL LOW (ref >60.00)
Glucose, Bld: 157 mg/dL — ABNORMAL HIGH (ref 70–99)
Potassium: 4.8 mEq/L (ref 3.5–5.1)
Sodium: 140 mEq/L (ref 135–145)
Total Bilirubin: 0.6 mg/dL (ref 0.2–1.2)
Total Protein: 8.3 g/dL (ref 6.0–8.3)

## 2023-01-27 LAB — VITAMIN B12: Vitamin B-12: 1191 pg/mL — ABNORMAL HIGH (ref 211–911)

## 2023-01-27 NOTE — Progress Notes (Signed)
No critical labs need to be addressed urgently. We will discuss labs in detail at upcoming office visit.   

## 2023-01-29 ENCOUNTER — Other Ambulatory Visit: Payer: Self-pay | Admitting: Family Medicine

## 2023-01-29 DIAGNOSIS — E785 Hyperlipidemia, unspecified: Secondary | ICD-10-CM

## 2023-02-02 ENCOUNTER — Ambulatory Visit (INDEPENDENT_AMBULATORY_CARE_PROVIDER_SITE_OTHER): Payer: Medicare Other | Admitting: Family Medicine

## 2023-02-02 VITALS — BP 140/76 | HR 83 | Temp 98.6°F | Ht 66.0 in | Wt 136.0 lb

## 2023-02-02 DIAGNOSIS — E119 Type 2 diabetes mellitus without complications: Secondary | ICD-10-CM

## 2023-02-02 DIAGNOSIS — Z7984 Long term (current) use of oral hypoglycemic drugs: Secondary | ICD-10-CM

## 2023-02-02 DIAGNOSIS — E1169 Type 2 diabetes mellitus with other specified complication: Secondary | ICD-10-CM

## 2023-02-02 DIAGNOSIS — I152 Hypertension secondary to endocrine disorders: Secondary | ICD-10-CM

## 2023-02-02 DIAGNOSIS — E785 Hyperlipidemia, unspecified: Secondary | ICD-10-CM

## 2023-02-02 DIAGNOSIS — E1122 Type 2 diabetes mellitus with diabetic chronic kidney disease: Secondary | ICD-10-CM | POA: Diagnosis not present

## 2023-02-02 DIAGNOSIS — N1832 Chronic kidney disease, stage 3b: Secondary | ICD-10-CM

## 2023-02-02 DIAGNOSIS — E1121 Type 2 diabetes mellitus with diabetic nephropathy: Secondary | ICD-10-CM

## 2023-02-02 DIAGNOSIS — E1159 Type 2 diabetes mellitus with other circulatory complications: Secondary | ICD-10-CM | POA: Diagnosis not present

## 2023-02-02 MED ORDER — METFORMIN HCL 500 MG PO TABS: 500.0000 mg | ORAL_TABLET | Freq: Two times a day (BID) | ORAL | 0 refills | Status: DC

## 2023-02-02 MED ORDER — SITAGLIPTIN PHOSPHATE 25 MG PO TABS
25.0000 mg | ORAL_TABLET | Freq: Every day | ORAL | 11 refills | Status: DC
Start: 2023-02-02 — End: 2023-02-07

## 2023-02-02 NOTE — Assessment & Plan Note (Signed)
Chronic  Not at goal in office today.  On amlodipine 10 mg p.o. dail, losartan 50 mg p.o. daily, Lopressor 25 mg p.o. twice daily 

## 2023-02-02 NOTE — Assessment & Plan Note (Addendum)
Stable, chronic.  Continue current medication. Daughter will help work on dietary changes but patient is very resistant to diet changes and is very picky about food.  We did discuss consideration of Crestor 20 mg instead of pravastatin.  If LDL remains above goal < 70, we will change to Crestor 20 mg p.o. daily.  Pravastatin 80 mg daily

## 2023-02-02 NOTE — Patient Instructions (Addendum)
Our office will call you to set up [facility] diabetic eye screening.  Next available date is July 18 but they will schedule you a date and time that is convenient for you.  Change back from ensure to Glucerna.  Work on low carb and low cholesterol diet as able. Stop metformin. Januvia 25 mg daily.  Follow blood sugars at home... sena message in 2 weeks  with blood sugar measurements.

## 2023-02-02 NOTE — Progress Notes (Signed)
Patient ID: Andrea [name], female    DOB: [birth year], 67 y.o.   MRN: [id number]  This visit was conducted in person.  BP (!) 140/76   Pulse 83   Temp 98.6 F (37 C) (Temporal)   Ht 5\' 6"  (1.676 m)   Wt 136 lb (61.7 kg)   SpO2 96%   BMI 21.95 kg/m    CC:  Chief Complaint  Patient presents with   Diabetes    Labs printed     Subjective:   HPI: Andrea [name] is a 67 y.o. female presenting on 02/02/2023 for Diabetes (Labs printed )   Last CPX/AMW: 06/2022  She is here with her daughters... lives with both of them.  [name]...  primary guardian [name]  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  Share care of mother. Mom  needs  help preparing food, she is able to feed herslef and Nees a little help dressing and toileting.  Some issues with chewing at times. Needs med administration  Can walk on short distances with quad carne, otherwise in wheelchair.  Uses bedside commode. Uses depends and bed pad for bladder incontinence.   Followed by Neurology Dr. Malvin Johns.   Hypertension:  Not at goal in office today  but at goal at home.  On amlodipine 10 mg p.o. dail, losartan 50 mg p.o. daily, Lopressor 25 mg p.o. twice daily.  BP Readings from Last 3 Encounters:  02/02/23 (!) 140/76  11/04/22 (!) 150/80  06/24/22 130/80  Using medication without problems or lightheadedness:  none Chest pain with exertion: none Edema: some in right leg.. they use compression pump. Short of breath: none Average home BPs:  132/78 Other issues:    Diabetes: Well-controlled on metformin 500 mg daily, associated with CKD  Using ensure instead of Glucerna. Pancakes  bannana. Lab Results  Component Value Date   HGBA1C 7.0 (H) 01/27/2023  Using medications without difficulties: Hypoglycemic episodes:? Hyperglycemic episodes:? Feet problems: no ulcers Blood Sugars averaging: not  checking eye exam within last year:  connected her with  [facility] for DM screening.  Elevated Cholesterol: LDL  almost at goal on pravastatin 80 mg daily, history of stroke goal LDL less than 70 Lab Results  Component Value Date   CHOL 171 01/27/2023   HDL 64.90 01/27/2023   LDLCALC 77 01/27/2023   TRIG 141.0 01/27/2023   CHOLHDL 3 01/27/2023  Using medications without problems: Muscle aches:  Diet compliance: Exercise: none Other complaints:  Wt Readings from Last 3 Encounters:  02/02/23 136 lb (61.7 kg)  11/04/22 138 lb 4 oz (62.7 kg)  06/24/22 130 lb 8 oz (59.2 kg)  Body mass index is 21.95 kg/m.  Worsening of GFR NOW  29,  CKD history  On ARB    CBC shows resolved anemia   B12 in nml range  Wt Readings from Last 3 Encounters:  02/02/23 136 lb (61.7 kg)  11/04/22 138 lb 4 oz (62.7 kg)  06/24/22 130 lb 8 oz (59.2 kg)     Relevant past medical, surgical, family and social history reviewed and updated as indicated. Interim medical history since our last visit reviewed. Allergies and medications reviewed and updated. Outpatient Medications Prior to Visit  Medication Sig Dispense Refill   amLODipine (NORVASC) 10 MG tablet Take 1 tablet by mouth once daily 90 tablet 0   aspirin EC 81 MG tablet Take 81 mg by mouth  daily.     Cholecalciferol 25 MCG (1000 UT) tablet Take 2,000 Units by mouth daily.     feeding supplement (ENSURE ENLIVE / ENSURE PLUS) LIQD Take 237 mLs by mouth 3 (three) times daily between meals. 237 mL 12   losartan (COZAAR) 50 MG tablet Take 1 tablet by mouth once daily 90 tablet 1   metoprolol tartrate (LOPRESSOR) 25 MG tablet Take 1 tablet (25 mg total) by mouth 2 (two) times daily. 180 tablet 0   Multiple Vitamins-Minerals (CENTRUM SILVER PO) Take 1 tablet by mouth daily.     nystatin cream (MYCOSTATIN) Apply to affected area 2 times daily 30 g 0   OVER THE COUNTER MEDICATION OMEGA 3 FISH OIL. 2000 MG. 1 DAILY     polyethylene glycol (MIRALAX / GLYCOLAX)  17 g packet Take 17 g by mouth 2 (two) times daily as needed. 14 each 0   pravastatin (PRAVACHOL) 80 MG tablet Take 1 tablet by mouth once daily 90 tablet 3   Probiotic Product (PROBIOTIC-10 PO) Take by mouth. 1 tablet daily     metFORMIN (GLUCOPHAGE) 500 MG tablet Take 1 tablet by mouth once daily with breakfast 90 tablet 1   vitamin C (ASCORBIC ACID) 500 MG tablet Take 500 mg by mouth daily. (Patient not taking: Reported on 02/02/2023)     No facility-administered medications prior to visit.     Per HPI unless specifically indicated in ROS section below Review of Systems  Constitutional:  Negative for fatigue and fever.  HENT:  Negative for congestion.   Eyes:  Negative for pain.  Respiratory:  Negative for cough and shortness of breath.   Cardiovascular:  Negative for chest pain, palpitations and leg swelling.  Gastrointestinal:  Negative for abdominal pain.  Genitourinary:  Negative for dysuria and vaginal bleeding.  Musculoskeletal:  Negative for back pain.  Neurological:  Negative for syncope, light-headedness and headaches.  Psychiatric/Behavioral:  Negative for dysphoric mood.    Objective:  BP (!) 140/76   Pulse 83   Temp 98.6 F (37 C) (Temporal)   Ht 5\' 6"  (1.676 m)   Wt 136 lb (61.7 kg)   SpO2 96%   BMI 21.95 kg/m   Wt Readings from Last 3 Encounters:  02/02/23 136 lb (61.7 kg)  11/04/22 138 lb 4 oz (62.7 kg)  06/24/22 130 lb 8 oz (59.2 kg)      Physical Exam Constitutional:      General: She is awake. She is not in acute distress.    Appearance: Normal appearance. She is well-developed. She is not ill-appearing or toxic-appearing.  HENT:     Head: Normocephalic.     Right Ear: Hearing, tympanic membrane, ear canal and external ear normal. Tympanic membrane is not erythematous, retracted or bulging.     Left Ear: Hearing, tympanic membrane, ear canal and external ear normal. Tympanic membrane is not erythematous, retracted or bulging.     Nose: No mucosal edema  or rhinorrhea.     Right Sinus: No maxillary sinus tenderness or frontal sinus tenderness.     Left Sinus: No maxillary sinus tenderness or frontal sinus tenderness.     Mouth/Throat:     Pharynx: Uvula midline.  Eyes:     General: Lids are normal. Lids are everted, no foreign bodies appreciated.     Conjunctiva/sclera: Conjunctivae normal.     Pupils: Pupils are equal, round, and reactive to light.  Neck:     Thyroid: No thyroid mass or thyromegaly.  Vascular: No carotid bruit.     Trachea: Trachea normal.  Cardiovascular:     Rate and Rhythm: Normal rate and regular rhythm.     Pulses: Normal pulses.     Heart sounds: Normal heart sounds, S1 normal and S2 normal. No murmur heard.    No friction rub. No gallop.  Pulmonary:     Effort: Pulmonary effort is normal. No tachypnea or respiratory distress.     Breath sounds: Normal breath sounds. No decreased breath sounds, wheezing, rhonchi or rales.  Abdominal:     General: Bowel sounds are normal.     Palpations: Abdomen is soft.     Tenderness: There is no abdominal tenderness.  Musculoskeletal:     Cervical back: Normal range of motion and neck supple.  Skin:    General: Skin is warm and dry.     Findings: No rash.  Neurological:     Mental Status: She is alert. Mental status is at baseline.     Cranial Nerves: Cranial nerves 2-12 are intact.     Sensory: Sensation is intact.     Motor: Weakness, atrophy and abnormal muscle tone present.     Coordination: Coordination abnormal.     Gait: Gait abnormal.     Comments: Nonverbal Right upper extremity paresis, right lower extremity 1/5 strength, except 0/5 in right fott flexion( has brace for foot drop)  Psychiatric:        Mood and Affect: Mood is not anxious or depressed.        Speech: Speech normal.        Behavior: Behavior normal. Behavior is cooperative.        Thought Content: Thought content normal.        Judgment: Judgment normal.       Results for orders  placed or performed in visit on 01/27/23  CBC with Differential/Platelet  Result Value Ref Range   WBC 9.6 4.0 - 10.5 K/uL   RBC 4.48 3.87 - 5.11 Mil/uL   Hemoglobin 12.4 12.0 - 15.0 g/dL   HCT 40.9 81.1 - 91.4 %   MCV 82.9 78.0 - 100.0 fl   MCHC 33.3 30.0 - 36.0 g/dL   RDW 78.2 95.6 - 21.3 %   Platelets 369.0 150.0 - 400.0 K/uL   Neutrophils Relative % 52.9 43.0 - 77.0 %   Lymphocytes Relative 36.2 12.0 - 46.0 %   Monocytes Relative 6.5 3.0 - 12.0 %   Eosinophils Relative 3.9 0.0 - 5.0 %   Basophils Relative 0.5 0.0 - 3.0 %   Neutro Abs 5.1 1.4 - 7.7 K/uL   Lymphs Abs 3.5 0.7 - 4.0 K/uL   Monocytes Absolute 0.6 0.1 - 1.0 K/uL   Eosinophils Absolute 0.4 0.0 - 0.7 K/uL   Basophils Absolute 0.1 0.0 - 0.1 K/uL  Vitamin B12  Result Value Ref Range   Vitamin B-12 1,191 (H) 211 - 911 pg/mL  Hemoglobin A1c  Result Value Ref Range   Hgb A1c MFr Bld 7.0 (H) 4.6 - 6.5 %  Lipid panel  Result Value Ref Range   Cholesterol 171 0 - 200 mg/dL   Triglycerides 086.5 0.0 - 149.0 mg/dL   HDL 78.46 >96.29 mg/dL   VLDL 52.8 0.0 - 41.3 mg/dL   LDL Cholesterol 77 0 - 99 mg/dL   Total CHOL/HDL Ratio 3    NonHDL 105.61   Comprehensive metabolic panel  Result Value Ref Range   Sodium 140 135 - 145 mEq/L   Potassium  4.8 3.5 - 5.1 mEq/L   Chloride 105 96 - 112 mEq/L   CO2 27 19 - 32 mEq/L   Glucose, Bld 157 (H) 70 - 99 mg/dL   BUN 20 6 - 23 mg/dL   Creatinine, Ser 1.61 (H) 0.40 - 1.20 mg/dL   Total Bilirubin 0.6 0.2 - 1.2 mg/dL   Alkaline Phosphatase 67 39 - 117 U/L   AST 16 0 - 37 U/L   ALT 14 0 - 35 U/L   Total Protein 8.3 6.0 - 8.3 g/dL   Albumin 4.2 3.5 - 5.2 g/dL   GFR 09.60 (L) >45.40 mL/min   Calcium 10.1 8.4 - 10.5 mg/dL    Assessment and Plan  Type 2 diabetes mellitus with stage 3b chronic kidney disease, without long-term current use of insulin (HCC) Assessment & Plan: Chronic, slight worsening despite metformin 500 mg daily.   GFR now < 30.... metformin  contraindicated. Will DC metformin and changed to Januvia 25 mg p.o. daily.    Associated with CKD  Followed by nephrology.    Diabetic nephropathy associated with type 2 diabetes mellitus (HCC) Assessment & Plan:  Chronic, worsening since last OV. GFR 29.  She keeps up with fluids and is not using NSAIDs. Followed by nephrology.   Hyperlipidemia associated with type 2 diabetes mellitus (HCC) Assessment & Plan: Stable, chronic.  Continue current medication. Daughter will help work on dietary changes but patient is very resistant to diet changes and is very picky about food.  We did discuss consideration of Crestor 20 mg instead of pravastatin.  If LDL remains above goal < 70, we will change to Crestor 20 mg p.o. daily.  Pravastatin 80 mg daily   Hypertension associated with diabetes [facility]) Assessment & Plan: Chronic  Not at goal in office today.  On amlodipine 10 mg p.o. dail, losartan 50 mg p.o. daily, Lopressor 25 mg p.o. twice daily   Diabetes mellitus treated with oral medication (HCC)  Type 2 diabetes mellitus with other specified complication, without long-term current use of insulin (HCC) Assessment & Plan: Chronic, slight worsening despite metformin 500 mg daily.   GFR now < 30.... metformin contraindicated. Will DC metformin and changed to Januvia 25 mg p.o. daily.    Associated with CKD  Followed by nephrology.    Other orders -     SITagliptin Phosphate; Take 1 tablet (25 mg total) by mouth daily.  Dispense: 30 tablet; Refill: 11     Return in about 3 months (around 05/05/2023) for diabetes follow up  POC A1C.   Kerby Nora, MD

## 2023-02-02 NOTE — Assessment & Plan Note (Addendum)
Chronic, slight worsening despite metformin 500 mg daily.   GFR now < 30.... metformin contraindicated. Will DC metformin and changed to Januvia 25 mg p.o. daily.    Associated with CKD  Followed by nephrology.

## 2023-02-02 NOTE — Assessment & Plan Note (Addendum)
Chronic, worsening since last OV. GFR 29.  She keeps up with fluids and is not using NSAIDs. Followed by nephrology.

## 2023-02-03 ENCOUNTER — Encounter: Payer: Self-pay | Admitting: Family Medicine

## 2023-02-03 NOTE — Telephone Encounter (Signed)
Have checked good rx the lowest price I see is 592.40 at [facility]

## 2023-02-07 ENCOUNTER — Other Ambulatory Visit: Payer: Self-pay | Admitting: Family Medicine

## 2023-02-07 MED ORDER — GLIPIZIDE ER 5 MG PO TB24
5.0000 mg | ORAL_TABLET | Freq: Every day | ORAL | 11 refills | Status: DC
Start: 2023-02-07 — End: 2024-01-22

## 2023-03-26 ENCOUNTER — Other Ambulatory Visit: Payer: Self-pay | Admitting: Family Medicine

## 2023-03-26 DIAGNOSIS — I10 Essential (primary) hypertension: Secondary | ICD-10-CM

## 2023-04-01 ENCOUNTER — Other Ambulatory Visit: Payer: Self-pay | Admitting: Family Medicine

## 2023-04-01 DIAGNOSIS — I10 Essential (primary) hypertension: Secondary | ICD-10-CM

## 2023-04-16 ENCOUNTER — Other Ambulatory Visit: Payer: Self-pay | Admitting: Family Medicine

## 2023-04-16 DIAGNOSIS — I10 Essential (primary) hypertension: Secondary | ICD-10-CM

## 2023-04-20 LAB — HM DIABETES EYE EXAM

## 2023-04-28 ENCOUNTER — Encounter: Payer: Self-pay | Admitting: Primary Care

## 2023-05-05 ENCOUNTER — Ambulatory Visit: Payer: Medicare Other | Admitting: Family Medicine

## 2023-05-09 ENCOUNTER — Encounter: Payer: Self-pay | Admitting: Family Medicine

## 2023-05-09 ENCOUNTER — Ambulatory Visit (INDEPENDENT_AMBULATORY_CARE_PROVIDER_SITE_OTHER): Payer: Medicare Other | Admitting: Family Medicine

## 2023-05-09 VITALS — BP 138/60 | HR 73 | Temp 97.2°F | Ht 66.0 in | Wt 144.4 lb

## 2023-05-09 DIAGNOSIS — E1159 Type 2 diabetes mellitus with other circulatory complications: Secondary | ICD-10-CM

## 2023-05-09 DIAGNOSIS — E1122 Type 2 diabetes mellitus with diabetic chronic kidney disease: Secondary | ICD-10-CM | POA: Diagnosis not present

## 2023-05-09 DIAGNOSIS — N1832 Chronic kidney disease, stage 3b: Secondary | ICD-10-CM

## 2023-05-09 DIAGNOSIS — E1169 Type 2 diabetes mellitus with other specified complication: Secondary | ICD-10-CM

## 2023-05-09 DIAGNOSIS — I152 Hypertension secondary to endocrine disorders: Secondary | ICD-10-CM

## 2023-05-09 LAB — POCT GLYCOSYLATED HEMOGLOBIN (HGB A1C): Hemoglobin A1C: 6.9 % — AB (ref 4.0–5.6)

## 2023-05-09 NOTE — Assessment & Plan Note (Signed)
Stable, chronic.  Continue current medication. Encouraged exercise, weight loss, healthy eating habits.  Glucotrol XL 5 mg daily.

## 2023-05-09 NOTE — Patient Instructions (Signed)
Can apply topical anti-fungal lacquer .Marland Kitchen If you decide she is interested in 3 month course of terbinafine... let me know.

## 2023-05-09 NOTE — Progress Notes (Signed)
Patient ID: Andrea [name], female    DOB: 1956/04/04, 67 y.o.   MRN: [id number]  This visit was conducted in person.  BP 138/60 (BP Location: Left Arm, Patient Position: Sitting, Cuff Size: Normal)   Pulse 73   Temp (!) 97.2 F (36.2 C) (Temporal)   Ht 5\' 6"  (1.676 m)   Wt 144 lb 6 oz (65.5 kg)   SpO2 97%   BMI 23.30 kg/m    CC:  Chief Complaint  Patient presents with   Diabetes    Subjective:   HPI: Andrea [name] is a 67 y.o. female presenting on 05/09/2023 for Diabetes   Last CPX/AMW: 06/2022  She is here with her daughter  Andrea [name]...  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  Share care of mother. Mom  needs  help preparing food, she is able to feed herslef and Nees a little help dressing and toileting.  Some issues with chewing at times. Needs med administration  Can walk on short distances with quad carne, otherwise in wheelchair.  Uses bedside commode. Uses depends and bed pad for bladder incontinence.   Followed by Neurology Dr. Malvin Johns.   Hypertension:  At goal in office today  but at goal at home.  On amlodipine 10 mg p.o. dail, losartan 50 mg p.o. daily, Lopressor 25 mg p.o. twice daily. BP Readings from Last 3 Encounters:  05/09/23 138/60  02/02/23 (!) 140/76  11/04/22 (!) 150/80  Using medication without problems or lightheadedness:  none Chest pain with exertion: none Edema: some in right leg.. they use compression pump. Short of breath: none Average home BPs:  132/78 Other issues:  Diabetes: Well-controlled on glipizide XL Cutting back on high carb foods, alternating glucerna and ensure. Lab Results  Component Value Date   HGBA1C 6.9 (A) 05/09/2023  Using medications without difficulties: Hypoglycemic episodes:? Hyperglycemic episodes:? Feet problems: no ulcers Blood Sugars averaging: not checking eye exam within last year:   connected her with  [facility] for DM screening.   Wt Readings from Last 3 Encounters:  05/09/23 144 lb 6 oz (65.5 kg)  02/02/23 136 lb (61.7 kg)  11/04/22 138 lb 4 oz (62.7 kg)    She has been cutting back on sodas per nephrology.  Relevant past medical, surgical, family and social history reviewed and updated as indicated. Interim medical history since our last visit reviewed. Allergies and medications reviewed and updated. Outpatient Medications Prior to Visit  Medication Sig Dispense Refill   amLODipine (NORVASC) 10 MG tablet Take 1 tablet by mouth once daily 90 tablet 1   aspirin EC 81 MG tablet Take 81 mg by mouth daily.     Cholecalciferol 25 MCG (1000 UT) tablet Take 2,000 Units by mouth daily.     feeding supplement (ENSURE ENLIVE / ENSURE PLUS) LIQD Take 237 mLs by mouth 3 (three) times daily between meals. 237 mL 12   glipiZIDE (GLUCOTROL XL) 5 MG 24 hr tablet Take 1 tablet (5 mg total) by mouth daily with breakfast. 30 tablet 11   losartan (COZAAR) 50 MG tablet Take 1 tablet by mouth once daily 90 tablet 1   metoprolol tartrate (LOPRESSOR) 25 MG tablet Take 1 tablet by mouth twice daily 180 tablet 1   OVER THE COUNTER MEDICATION OMEGA 3 FISH OIL. 2000 MG. 1 DAILY     polyethylene glycol (MIRALAX / GLYCOLAX) 17 g packet Take 17  g by mouth 2 (two) times daily as needed. 14 each 0   pravastatin (PRAVACHOL) 80 MG tablet Take 1 tablet by mouth once daily 90 tablet 3   Probiotic Product (PROBIOTIC-10 PO) Take by mouth. 1 tablet daily     Multiple Vitamins-Minerals (CENTRUM SILVER PO) Take 1 tablet by mouth daily.     nystatin cream (MYCOSTATIN) Apply to affected area 2 times daily 30 g 0   vitamin C (ASCORBIC ACID) 500 MG tablet Take 500 mg by mouth daily. (Patient not taking: Reported on 02/02/2023)     No facility-administered medications prior to visit.     Per HPI unless specifically indicated in ROS section below Review of Systems  Constitutional:  Negative for fatigue and fever.   HENT:  Negative for congestion.   Eyes:  Negative for pain.  Respiratory:  Negative for cough and shortness of breath.   Cardiovascular:  Negative for chest pain, palpitations and leg swelling.  Gastrointestinal:  Negative for abdominal pain.  Genitourinary:  Negative for dysuria and vaginal bleeding.  Musculoskeletal:  Negative for back pain.  Neurological:  Negative for syncope, light-headedness and headaches.  Psychiatric/Behavioral:  Negative for dysphoric mood.    Objective:  BP 138/60 (BP Location: Left Arm, Patient Position: Sitting, Cuff Size: Normal)   Pulse 73   Temp (!) 97.2 F (36.2 C) (Temporal)   Ht 5\' 6"  (1.676 m)   Wt 144 lb 6 oz (65.5 kg)   SpO2 97%   BMI 23.30 kg/m   Wt Readings from Last 3 Encounters:  05/09/23 144 lb 6 oz (65.5 kg)  02/02/23 136 lb (61.7 kg)  11/04/22 138 lb 4 oz (62.7 kg)      Physical Exam Constitutional:      General: She is awake. She is not in acute distress.    Appearance: Normal appearance. She is well-developed. She is not ill-appearing or toxic-appearing.  HENT:     Head: Normocephalic.     Right Ear: Hearing, tympanic membrane, ear canal and external ear normal. Tympanic membrane is not erythematous, retracted or bulging.     Left Ear: Hearing, tympanic membrane, ear canal and external ear normal. Tympanic membrane is not erythematous, retracted or bulging.     Nose: No mucosal edema or rhinorrhea.     Right Sinus: No maxillary sinus tenderness or frontal sinus tenderness.     Left Sinus: No maxillary sinus tenderness or frontal sinus tenderness.     Mouth/Throat:     Pharynx: Uvula midline.  Eyes:     General: Lids are normal. Lids are everted, no foreign bodies appreciated.     Conjunctiva/sclera: Conjunctivae normal.     Pupils: Pupils are equal, round, and reactive to light.  Neck:     Thyroid: No thyroid mass or thyromegaly.     Vascular: No carotid bruit.     Trachea: Trachea normal.  Cardiovascular:     Rate and  Rhythm: Normal rate and regular rhythm.     Pulses: Normal pulses.     Heart sounds: Normal heart sounds, S1 normal and S2 normal. No murmur heard.    No friction rub. No gallop.  Pulmonary:     Effort: Pulmonary effort is normal. No tachypnea or respiratory distress.     Breath sounds: Normal breath sounds. No decreased breath sounds, wheezing, rhonchi or rales.  Abdominal:     General: Bowel sounds are normal.     Palpations: Abdomen is soft.     Tenderness: There is  no abdominal tenderness.  Musculoskeletal:     Cervical back: Normal range of motion and neck supple.  Skin:    General: Skin is warm and dry.     Findings: No rash.  Neurological:     Mental Status: She is alert. Mental status is at baseline.     Cranial Nerves: Cranial nerves 2-12 are intact.     Sensory: Sensation is intact.     Motor: Weakness, atrophy and abnormal muscle tone present.     Coordination: Coordination abnormal.     Gait: Gait abnormal.     Comments: Nonverbal Right upper extremity paresis, right lower extremity 1/5 strength, except 0/5 in right fott flexion( has brace for foot drop)  Psychiatric:        Mood and Affect: Mood is not anxious or depressed.        Speech: Speech normal.        Behavior: Behavior normal. Behavior is cooperative.        Thought Content: Thought content normal.        Judgment: Judgment normal.    Discoloration and thickening of right great toenail.     Results for orders placed or performed in visit on 05/09/23  POCT glycosylated hemoglobin (Hb A1C)  Result Value Ref Range   Hemoglobin A1C 6.9 (A) 4.0 - 5.6 %   HbA1c POC (<> result, manual entry)     HbA1c, POC (prediabetic range)     HbA1c, POC (controlled diabetic range)      Assessment and Plan  Type 2 diabetes mellitus with stage 3b chronic kidney disease, without long-term current use of insulin (HCC) -     POCT glycosylated hemoglobin (Hb A1C)     No follow-ups on file.   Kerby Nora, MD

## 2023-05-09 NOTE — Assessment & Plan Note (Signed)
Chronic, at goal.   On amlodipine 10 mg p.o. dail, losartan 50 mg p.o. daily, Lopressor 25 mg p.o. twice daily

## 2023-07-11 ENCOUNTER — Ambulatory Visit (INDEPENDENT_AMBULATORY_CARE_PROVIDER_SITE_OTHER): Payer: Medicare Other

## 2023-07-11 VITALS — Ht 66.0 in | Wt 144.0 lb

## 2023-07-11 DIAGNOSIS — Z0000 Encounter for general adult medical examination without abnormal findings: Secondary | ICD-10-CM

## 2023-07-11 NOTE — Patient Instructions (Signed)
Ms. Dileo , Thank you for taking time to come for your Medicare Wellness Visit. I appreciate your ongoing commitment to your health goals. Please review the following plan we discussed and let me know if I can assist you in the future.   Referrals/Orders/Follow-Ups/Clinician Recommendations: Aim for 30 minutes of exercise or brisk walking, 6-8 glasses of water, and 5 servings of fruits and vegetables each day.   This is a list of the screening recommended for you and due dates:  Health Maintenance  Topic Date Due   Colon Cancer Screening  Never done   Mammogram  Never done   Zoster (Shingles) Vaccine (1 of 2) 10/12/2007   Pap with HPV screening  05/06/2017   Pneumonia Vaccine (2 of 2 - PCV) 10/11/2022   DEXA scan (bone density measurement)  Never done   COVID-19 Vaccine (1 - 2023-24 season) Never done   Yearly kidney health urinalysis for diabetes  06/25/2023   Complete foot exam   06/25/2023   Flu Shot  01/01/2024*   Hemoglobin A1C  11/09/2023   Yearly kidney function blood test for diabetes  01/27/2024   Eye exam for diabetics  04/19/2024   Medicare Annual Wellness Visit  07/10/2024   DTaP/Tdap/Td vaccine (2 - Td or Tdap) 05/12/2026   Hepatitis C Screening  Completed   HIV Screening  Completed   HPV Vaccine  Aged Out  *Topic was postponed. The date shown is not the original due date.    Advanced directives: (Provided) Advance directive discussed with you today. I have provided a copy for you to complete at home and have notarized. Once this is complete, please bring a copy in to our office so we can scan it into your chart. Information on Advanced Care Planning can be found at [facility] [place] Advance Health Care Directives Advance Health Care Directives ([email or web address]    Next Medicare Annual Wellness Visit scheduled for next year: Yes  Insert Preventive Care attachment Insert FALL PREVENTION attachment if needed

## 2023-07-11 NOTE — Progress Notes (Signed)
Subjective:   Andrea [name] is a 67 y.o. female who presents for Medicare Annual (Subsequent) preventive examination.  Visit Complete: Virtual I connected with  Andrea [name] on 07/11/23 by a audio enabled telemedicine application and verified that I am speaking with the correct person using two identifiers.  Patient Location: Home  Provider Location: Home Office  I discussed the limitations of evaluation and management by telemedicine. The patient expressed understanding and agreed to proceed.  Vital Signs: Because this visit was a virtual/telehealth visit, some criteria may be missing or patient reported. Any vitals not documented were not able to be obtained and vitals that have been documented are patient reported.  Patient Medicare AWV questionnaire was completed by the patient on 07/11/2023 ; I have confirmed that all information answered by patient is correct and no changes since this date.  Cardiac Risk Factors include: advanced age (>67men, >94 women);diabetes mellitus;dyslipidemia;hypertension;sedentary lifestyle     Objective:    Today's Vitals   07/11/23 1507  Weight: 144 lb (65.3 kg)  Height: 5\' 6"  (1.676 m)   Body mass index is 23.24 kg/m.     07/11/2023    3:12 PM 06/24/2022    9:39 AM 05/23/2022    5:12 PM 06/19/2021   11:36 AM 10/30/2020   12:27 PM  Advanced Directives  Does Patient Have a Medical Advance Directive? No No No Yes No  Type of Primary school teacher of Healthcare Power of Attorney in Chart?    Yes - validated most recent copy scanned in chart (See row information)   Would patient like information on creating a medical advance directive? Yes (MAU/Ambulatory/Procedural Areas - Information given) Yes (MAU/Ambulatory/Procedural Areas - Information given)   No - Patient declined    Current Medications (verified) Outpatient Encounter Medications as of 07/11/2023  Medication Sig   amLODipine (NORVASC) 10 MG tablet Take 1  tablet by mouth once daily   aspirin EC 81 MG tablet Take 81 mg by mouth daily.   Cholecalciferol 25 MCG (1000 UT) tablet Take 2,000 Units by mouth daily.   feeding supplement (ENSURE ENLIVE / ENSURE PLUS) LIQD Take 237 mLs by mouth 3 (three) times daily between meals.   glipiZIDE (GLUCOTROL XL) 5 MG 24 hr tablet Take 1 tablet (5 mg total) by mouth daily with breakfast.   losartan (COZAAR) 50 MG tablet Take 1 tablet by mouth once daily   metoprolol tartrate (LOPRESSOR) 25 MG tablet Take 1 tablet by mouth twice daily   OVER THE COUNTER MEDICATION OMEGA 3 FISH OIL. 2000 MG. 1 DAILY   polyethylene glycol (MIRALAX / GLYCOLAX) 17 g packet Take 17 g by mouth 2 (two) times daily as needed.   pravastatin (PRAVACHOL) 80 MG tablet Take 1 tablet by mouth once daily   Probiotic Product (PROBIOTIC-10 PO) Take by mouth. 1 tablet daily   No facility-administered encounter medications on file as of 07/11/2023.    Allergies (verified) Citric acid and Citrus   History: Past Medical History:  Diagnosis Date   Stroke [facility])    Past Surgical History:  Procedure Laterality Date   BREAST BIOPSY Left    CESAREAN SECTION     4    CRANIOPLASTY Left 08/08/2017   CRANIOTOMY FOR HEMISPHERECTOMY TOTAL / PARTIAL Left 02/16/2017   PATELLA FRACTURE SURGERY Right    PERCUTANEOUS ENDOSCOPIC GASTROSTOMY (PEG) REMOVAL     TUBAL LIGATION     Family History  Problem Relation Age of  Onset   Stroke Mother    Diabetes Mother    Hypertension Mother    Aneurysm Father    Diabetes Sister    Hypertension Sister    Cancer Sister        breast   Diabetes Sister    Hypertension Sister    Hypertension Sister    Hypertension Sister    Social History   Socioeconomic History   Marital status: Widowed    Spouse name: Not on file   Number of children: 4   Years of education: Master's degree   Highest education level: Not on file  Occupational History   Not on file  Tobacco Use   Smoking status: Former    Current  packs/day: 0.00    Average packs/day: 0.3 packs/day for 30.0 years (7.5 ttl pk-yrs)    Types: Cigarettes    Start date: 12/1986    Quit date: 12/2016    Years since quitting: 6.6   Smokeless tobacco: Never  Vaping Use   Vaping status: Never Used  Substance and Sexual Activity   Alcohol use: Never   Drug use: Never   Sexual activity: Not Currently  Other Topics Concern   Not on file  Social History Narrative   Lives with Andrea [name] (Daughter), also with Andrea [name]'s daughter, and her other daughter Andrea [name]) and children   Just moved to the area from [place], [place]         Social Determinants of Health   Financial Resource Strain: Low Risk  (07/11/2023)   Overall Financial Resource Strain (CARDIA)    Difficulty of Paying Living Expenses: Not hard at all  Food Insecurity: No Food Insecurity (07/11/2023)   Hunger Vital Sign    Worried About Running Out of Food in the Last Year: Never true    Ran Out of Food in the Last Year: Never true  Transportation Needs: No Transportation Needs (07/11/2023)   PRAPARE - Administrator, Civil Service (Medical): No    Lack of Transportation (Non-Medical): No  Physical Activity: Insufficiently Active (07/11/2023)   Exercise Vital Sign    Days of Exercise per Week: 2 days    Minutes of Exercise per Session: 20 min  Stress: No Stress Concern Present (07/11/2023)   [employer] of Occupational Health - Occupational Stress Questionnaire    Feeling of Stress : Not at all  Social Connections: Socially Isolated (07/11/2023)   Social Connection and Isolation Panel [NHANES]    Frequency of Communication with Friends and Family: More than three times a week    Frequency of Social Gatherings with Friends and Family: Three times a week    Attends Religious Services: Never    Active Member of Clubs or Organizations: No    Attends Banker Meetings: Never    Marital Status: Widowed    Tobacco Counseling Counseling  given: Not Answered   Clinical Intake:  Pre-visit preparation completed: Yes  Pain : No/denies pain     Nutritional Risks: None Diabetes: Yes CBG done?: No Did pt. bring in CBG monitor from home?: No  How often do you need to have someone help you when you read instructions, pamphlets, or other written materials from your doctor or pharmacy?: 1 - Never  Interpreter Needed?: No  Information entered by :: Renie Ora, LPN   Activities of Daily Living    07/11/2023    3:12 PM 07/11/2023    2:56 PM  In your present state of health, do you  have any difficulty performing the following activities:  Hearing? 0 0  Vision? 0 1  Difficulty concentrating or making decisions? 0 1  Walking or climbing stairs? 0 1  Dressing or bathing? 0 1  Doing errands, shopping? 0 1  Preparing Food and eating ? N Y  Using the Toilet? N Y  In the past six months, have you accidently leaked urine? N Y  Do you have problems with loss of bowel control? N N  Managing your Medications? N Y  Managing your Finances? N Y  Housekeeping or managing your Housekeeping? N Y    Patient Care Team: Excell Seltzer, MD as PCP - General (Family Medicine)  Indicate any recent Medical Services you may have received from other than [facility] providers in the past year (date may be approximate).     Assessment:   This is a routine wellness examination for Andrea [name].  Hearing/Vision screen Vision Screening - Comments:: Wears rx glasses - up to date with routine eye exams with  [facility] and [facility]    Goals Addressed             This Visit's Progress    Patient Stated   On track    Family would like to get her to do more activity       Depression Screen    07/11/2023    3:10 PM 02/02/2023    3:43 PM 11/04/2022    4:07 PM 06/24/2022   10:08 AM 06/19/2021   11:20 AM 08/31/2020    2:37 PM  PHQ 2/9 Scores  PHQ - 2 Score 0 0 0 0 0 0  PHQ- 9 Score  0 0       Fall Risk    07/11/2023    3:09 PM  07/11/2023    2:56 PM 02/02/2023    3:44 PM 11/04/2022    4:06 PM 06/24/2022    9:28 AM  Fall Risk   Falls in the past year? 0 0 Exclusion - non ambulatory 0 0  Number falls in past yr: 0   0 0  Injury with Fall? 0   0   Risk for fall due to : No Fall Risks   No Fall Risks Impaired balance/gait;Impaired mobility  Follow up Falls prevention discussed   Falls evaluation completed     MEDICARE RISK AT HOME: Medicare Risk at Home Any stairs in or around the home?: Yes If so, are there any without handrails?: No Home free of loose throw rugs in walkways, pet beds, electrical cords, etc?: Yes Adequate lighting in your home to reduce risk of falls?: Yes Life alert?: No Use of a cane, walker or w/c?: No Grab bars in the bathroom?: Yes Shower chair or bench in shower?: Yes Elevated toilet seat or a handicapped toilet?: Yes  TIMED UP AND GO:  Was the test performed?  No    Cognitive Function:        07/11/2023    3:12 PM 06/19/2021   11:21 AM  6CIT Screen  What Year? 4 points 0 points  What month? 0 points 0 points  What time? 3 points 3 points  Count back from 20 0 points 4 points  Months in reverse 4 points 4 points  Repeat phrase 6 points 10 points  Total Score 17 points 21 points    Immunizations Immunization History  Administered Date(s) Administered   Influenza, Quadrivalent, Recombinant, Inj, Pf 08/10/2017   Influenza,inj,Quad PF,6+ Mos 11/10/2020, 06/24/2022  MMR 04/22/1996, 03/24/1998   Pneumococcal Polysaccharide-23 11/10/2020   Tdap 05/12/2016   Zoster, Live 05/14/2016    TDAP status: Up to date  Flu Vaccine status: Due, Education has been provided regarding the importance of this vaccine. Advised may receive this vaccine at local pharmacy or Health Dept. Aware to provide a copy of the vaccination record if obtained from local pharmacy or Health Dept. Verbalized acceptance and understanding.  Pneumococcal vaccine status: Up to date  Covid-19 vaccine status:  Declined, Education has been provided regarding the importance of this vaccine but patient still declined. Advised may receive this vaccine at local pharmacy or Health Dept.or vaccine clinic. Aware to provide a copy of the vaccination record if obtained from local pharmacy or Health Dept. Verbalized acceptance and understanding.  Qualifies for Shingles Vaccine? Yes   Zostavax completed No   Shingrix Completed?: No.    Education has been provided regarding the importance of this vaccine. Patient has been advised to call insurance company to determine out of pocket expense if they have not yet received this vaccine. Advised may also receive vaccine at local pharmacy or Health Dept. Verbalized acceptance and understanding.  Screening Tests Health Maintenance  Topic Date Due   Colonoscopy  Never done   MAMMOGRAM  Never done   Zoster Vaccines- Shingrix (1 of 2) 10/12/2007   Cervical Cancer Screening (HPV/Pap Cotest)  05/06/2017   Pneumonia Vaccine 66+ Years old (2 of 2 - PCV) 10/11/2022   DEXA SCAN  Never done   COVID-19 Vaccine (1 - 2023-24 season) Never done   Diabetic kidney evaluation - Urine ACR  06/25/2023   FOOT EXAM  06/25/2023   INFLUENZA VACCINE  01/01/2024 (Originally 05/04/2023)   HEMOGLOBIN A1C  11/09/2023   Diabetic kidney evaluation - eGFR measurement  01/27/2024   OPHTHALMOLOGY EXAM  04/19/2024   Medicare Annual Wellness (AWV)  07/10/2024   DTaP/Tdap/Td (2 - Td or Tdap) 05/12/2026   Hepatitis C Screening  Completed   HIV Screening  Completed   HPV VACCINES  Aged Out    Health Maintenance  Health Maintenance Due  Topic Date Due   Colonoscopy  Never done   MAMMOGRAM  Never done   Zoster Vaccines- Shingrix (1 of 2) 10/12/2007   Cervical Cancer Screening (HPV/Pap Cotest)  05/06/2017   Pneumonia Vaccine 80+ Years old (2 of 2 - PCV) 10/11/2022   DEXA SCAN  Never done   COVID-19 Vaccine (1 - 2023-24 season) Never done   Diabetic kidney evaluation - Urine ACR  06/25/2023    FOOT EXAM  06/25/2023    Colorectal cancer screening: Referral to GI placed declined . Pt aware the office will call re: appt.  Mammogram status: Ordered declined . Pt provided with contact info and advised to call to schedule appt.   Bone Density status: Ordered declined . Pt provided with contact info and advised to call to schedule appt.  Lung Cancer Screening: (Low Dose CT Chest recommended if Age 42-80 years, 20 pack-year currently smoking OR have quit w/in 15years.) does not qualify.   Lung Cancer Screening Referral: n/a  Additional Screening:  Hepatitis C Screening: does not qualify; Completed 06/10/2021  Vision Screening: Recommended annual ophthalmology exams for early detection of glaucoma and other disorders of the eye. Is the patient up to date with their annual eye exam?  No  Who is the provider or what is the name of the office in which the patient attends annual eye exams? [facility] and [facility]  If pt is not established with a provider, would they like to be referred to a provider to establish care? No .   Dental Screening: Recommended annual dental exams for proper oral hygiene  Diabetic Foot Exam: Diabetic Foot Exam: Overdue, Pt has been advised about the importance in completing this exam. Pt is scheduled for diabetic foot exam on next office visit .  Community Resource Referral / Chronic Care Management: CRR required this visit?  No   CCM required this visit?  No     Plan:     I have personally reviewed and noted the following in the patient's chart:   Medical and social history Use of alcohol, tobacco or illicit drugs  Current medications and supplements including opioid prescriptions. Patient is not currently taking opioid prescriptions. Functional ability and status Nutritional status Physical activity Advanced directives List of other physicians Hospitalizations, surgeries, and ER visits in previous 12 months Vitals Screenings to include  cognitive, depression, and falls Referrals and appointments  In addition, I have reviewed and discussed with patient certain preventive protocols, quality metrics, and best practice recommendations. A written personalized care plan for preventive services as well as general preventive health recommendations were provided to patient.     Lorrene Reid, LPN   16/10/958   After Visit Summary: (MyChart) Due to this being a telephonic visit, the after visit summary with patients personalized plan was offered to patient via MyChart   Nurse Notes: none

## 2023-07-12 DIAGNOSIS — N2581 Secondary hyperparathyroidism of renal origin: Secondary | ICD-10-CM | POA: Insufficient documentation

## 2023-07-14 ENCOUNTER — Encounter: Payer: Self-pay | Admitting: Family Medicine

## 2023-07-19 ENCOUNTER — Telehealth: Payer: Self-pay | Admitting: *Deleted

## 2023-07-19 DIAGNOSIS — E1169 Type 2 diabetes mellitus with other specified complication: Secondary | ICD-10-CM

## 2023-07-19 DIAGNOSIS — E1122 Type 2 diabetes mellitus with diabetic chronic kidney disease: Secondary | ICD-10-CM

## 2023-07-19 NOTE — Telephone Encounter (Signed)
-----   Message from Alvina Chou sent at 07/19/2023  9:40 AM EDT ----- Regarding: Lab orders for Fri, 11.1.24 Patient is scheduled for CPX labs, please order future labs, Thanks , Camelia Eng

## 2023-08-04 ENCOUNTER — Other Ambulatory Visit (INDEPENDENT_AMBULATORY_CARE_PROVIDER_SITE_OTHER): Payer: Medicare Other

## 2023-08-04 ENCOUNTER — Encounter: Payer: Self-pay | Admitting: Family Medicine

## 2023-08-04 DIAGNOSIS — E785 Hyperlipidemia, unspecified: Secondary | ICD-10-CM | POA: Diagnosis not present

## 2023-08-04 DIAGNOSIS — N1832 Chronic kidney disease, stage 3b: Secondary | ICD-10-CM

## 2023-08-04 DIAGNOSIS — N184 Chronic kidney disease, stage 4 (severe): Secondary | ICD-10-CM | POA: Insufficient documentation

## 2023-08-04 DIAGNOSIS — E1169 Type 2 diabetes mellitus with other specified complication: Secondary | ICD-10-CM

## 2023-08-04 DIAGNOSIS — E1122 Type 2 diabetes mellitus with diabetic chronic kidney disease: Secondary | ICD-10-CM

## 2023-08-04 LAB — COMPREHENSIVE METABOLIC PANEL
ALT: 16 U/L (ref 0–35)
AST: 13 U/L (ref 0–37)
Albumin: 4.3 g/dL (ref 3.5–5.2)
Alkaline Phosphatase: 71 U/L (ref 39–117)
BUN: 26 mg/dL — ABNORMAL HIGH (ref 6–23)
CO2: 24 meq/L (ref 19–32)
Calcium: 9.9 mg/dL (ref 8.4–10.5)
Chloride: 108 meq/L (ref 96–112)
Creatinine, Ser: 2.21 mg/dL — ABNORMAL HIGH (ref 0.40–1.20)
GFR: 22.78 mL/min — ABNORMAL LOW (ref >60.00)
Glucose, Bld: 129 mg/dL — ABNORMAL HIGH (ref 70–99)
Potassium: 4 meq/L (ref 3.5–5.1)
Sodium: 142 meq/L (ref 135–145)
Total Bilirubin: 0.4 mg/dL (ref 0.2–1.2)
Total Protein: 8.3 g/dL (ref 6.0–8.3)

## 2023-08-04 LAB — LIPID PANEL
Cholesterol: 147 mg/dL (ref 0–200)
HDL: 53.3 mg/dL (ref >39.00)
LDL Cholesterol: 68 mg/dL (ref 0–99)
NonHDL: 93.38
Total CHOL/HDL Ratio: 3
Triglycerides: 125 mg/dL (ref 0.0–149.0)
VLDL: 25 mg/dL (ref 0.0–40.0)

## 2023-08-04 LAB — MICROALBUMIN / CREATININE URINE RATIO
Creatinine,U: 128.3 mg/dL
Microalb Creat Ratio: 236.4 mg/g — ABNORMAL HIGH (ref 0.0–30.0)
Microalb, Ur: 303.2 mg/dL — ABNORMAL HIGH (ref 0.0–1.9)

## 2023-08-04 NOTE — Progress Notes (Signed)
No critical labs need to be addressed urgently. We will discuss labs in detail at upcoming office visit.   

## 2023-08-11 ENCOUNTER — Ambulatory Visit (INDEPENDENT_AMBULATORY_CARE_PROVIDER_SITE_OTHER): Payer: Medicare Other | Admitting: Family Medicine

## 2023-08-11 ENCOUNTER — Encounter: Payer: Self-pay | Admitting: Family Medicine

## 2023-08-11 VITALS — BP 140/72 | HR 82 | Temp 98.1°F | Wt 145.2 lb

## 2023-08-11 DIAGNOSIS — E1159 Type 2 diabetes mellitus with other circulatory complications: Secondary | ICD-10-CM

## 2023-08-11 DIAGNOSIS — E785 Hyperlipidemia, unspecified: Secondary | ICD-10-CM

## 2023-08-11 DIAGNOSIS — Z23 Encounter for immunization: Secondary | ICD-10-CM

## 2023-08-11 DIAGNOSIS — I152 Hypertension secondary to endocrine disorders: Secondary | ICD-10-CM

## 2023-08-11 DIAGNOSIS — N184 Chronic kidney disease, stage 4 (severe): Secondary | ICD-10-CM

## 2023-08-11 DIAGNOSIS — E1121 Type 2 diabetes mellitus with diabetic nephropathy: Secondary | ICD-10-CM

## 2023-08-11 DIAGNOSIS — E440 Moderate protein-calorie malnutrition: Secondary | ICD-10-CM | POA: Diagnosis not present

## 2023-08-11 DIAGNOSIS — Z7984 Long term (current) use of oral hypoglycemic drugs: Secondary | ICD-10-CM

## 2023-08-11 DIAGNOSIS — G8190 Hemiplegia, unspecified affecting unspecified side: Secondary | ICD-10-CM | POA: Diagnosis not present

## 2023-08-11 DIAGNOSIS — E1169 Type 2 diabetes mellitus with other specified complication: Secondary | ICD-10-CM

## 2023-08-11 DIAGNOSIS — N2581 Secondary hyperparathyroidism of renal origin: Secondary | ICD-10-CM

## 2023-08-11 LAB — HM DIABETES FOOT EXAM

## 2023-08-11 NOTE — Progress Notes (Signed)
Patient ID: Andrea [name], female    DOB: Jun 08, 1956, 67 y.o.   MRN: [id number]  This visit was conducted in person.  BP (!) 140/72 (BP Location: Left Arm, Patient Position: Sitting, Cuff Size: Normal)   Pulse 82   Temp 98.1 F (36.7 C) (Temporal)   Wt 145 lb 4 oz (65.9 kg)   SpO2 98%   BMI 23.44 kg/m    CC:  Chief Complaint  Patient presents with   Annual Exam    Part 2     Subjective:   HPI: Andrea [name] is a 67 y.o. female presenting on 08/11/2023 for Annual Exam (Part 2 ) The patient presents for  complete physical and review of chronic health problems. He/She also has the following acute concerns today:   The patient saw a LPN or RN for medicare wellness visit.  July 11, 2023  Prevention and wellness was reviewed in detail. Note reviewed and important notes copied below.  She is here with her daughter.. lives with both of them.  [name]...  primary guardian [name]  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  Share care of mother. Mom  needs  help preparing food, she is able to feed herslef and Nees a little help dressing and toileting.  Some issues with chewing at times. Needs med administration  Can walk on short distances with quad carne, otherwise in wheelchair.  Uses bedside commode. Uses depends and bed pad for bladder incontinence.   Followed by Neurology Dr. Malvin Johns.   Hypertension: Slightly above goal in office today.  On amlodipine 10 mg p.o. dail, losartan 50 mg p.o. daily, Lopressor 25 mg p.o. twice daily...  BP Readings from Last 3 Encounters:  08/11/23 (!) 140/72  05/09/23 138/60  02/02/23 (!) 140/76  Using medication without problems or lightheadedness:  none Chest pain with exertion: none Edema: some in right leg.. they use compression pump. Short of breath: none Average home BPs: 132-150/70-80 Other issues:    Diabetes:  Well-controlled  gluctorol , associated with CKD Lab Results  Component Value Date   HGBA1C 6.9 (A) 05/09/2023  Using medications without difficulties: Hypoglycemic episodes: none Hyperglycemic episodes: Feet problems:  toenail grown back  Blood Sugars averaging: 107-114 eye exam within last year: yes  Elevated Cholesterol: LDL at goal on pravastatin 80 mg daily, history of stroke goal LDL less than 70 Lab Results  Component Value Date   CHOL 147 08/04/2023   HDL 53.30 08/04/2023   LDLCALC 68 08/04/2023   TRIG 125.0 08/04/2023   CHOLHDL 3 08/04/2023  Using medications without problems: Muscle aches:  Diet compliance: Exercise: Other complaints:  Wt Readings from Last 3 Encounters:  08/11/23 145 lb 4 oz (65.9 kg)  07/11/23 144 lb (65.3 kg)  05/09/23 144 lb 6 oz (65.5 kg)  Body mass index is 23.44 kg/m.    Relevant past medical, surgical, family and social history reviewed and updated as indicated. Interim medical history since our last visit reviewed. Allergies and medications reviewed and updated. Outpatient Medications Prior to Visit  Medication Sig Dispense Refill   amLODipine (NORVASC) 10 MG tablet Take 1 tablet by mouth once daily 90 tablet 1   aspirin EC 81 MG tablet Take 81 mg by mouth daily.     Cholecalciferol 25 MCG (1000 UT) tablet Take 2,000 Units by mouth daily.     feeding supplement (ENSURE ENLIVE / ENSURE PLUS)  LIQD Take 237 mLs by mouth 3 (three) times daily between meals. 237 mL 12   glipiZIDE (GLUCOTROL XL) 5 MG 24 hr tablet Take 1 tablet (5 mg total) by mouth daily with breakfast. 30 tablet 11   losartan (COZAAR) 50 MG tablet Take 1 tablet by mouth once daily 90 tablet 1   metoprolol tartrate (LOPRESSOR) 25 MG tablet Take 1 tablet by mouth twice daily 180 tablet 1   OVER THE COUNTER MEDICATION OMEGA 3 FISH OIL. 2000 MG. 1 DAILY     polyethylene glycol (MIRALAX / GLYCOLAX) 17 g packet Take 17 g by mouth 2 (two) times daily as needed. 14 each 0    pravastatin (PRAVACHOL) 80 MG tablet Take 1 tablet by mouth once daily 90 tablet 3   Probiotic Product (PROBIOTIC-10 PO) Take by mouth. 1 tablet daily     No facility-administered medications prior to visit.     Per HPI unless specifically indicated in ROS section below Review of Systems  Constitutional:  Negative for fatigue, fever and unexpected weight change.  HENT:  Negative for congestion, ear pain, sinus pressure, sneezing, sore throat and trouble swallowing.   Eyes:  Negative for pain and itching.  Respiratory:  Negative for cough, shortness of breath and wheezing.   Cardiovascular:  Negative for chest pain, palpitations and leg swelling.  Gastrointestinal:  Negative for abdominal pain, blood in stool, constipation, diarrhea and nausea.  Genitourinary:  Negative for difficulty urinating, dysuria, hematuria, menstrual problem, vaginal bleeding and vaginal discharge.  Musculoskeletal:  Negative for back pain.  Skin:  Negative for rash.  Neurological:  Negative for syncope, weakness, light-headedness, numbness and headaches.  Psychiatric/Behavioral:  Negative for confusion and dysphoric mood. The patient is not nervous/anxious.    Objective:  BP (!) 140/72 (BP Location: Left Arm, Patient Position: Sitting, Cuff Size: Normal)   Pulse 82   Temp 98.1 F (36.7 C) (Temporal)   Wt 145 lb 4 oz (65.9 kg)   SpO2 98%   BMI 23.44 kg/m   Wt Readings from Last 3 Encounters:  08/11/23 145 lb 4 oz (65.9 kg)  07/11/23 144 lb (65.3 kg)  05/09/23 144 lb 6 oz (65.5 kg)      Physical Exam Constitutional:      General: She is awake. She is not in acute distress.    Appearance: Normal appearance. She is well-developed. She is not ill-appearing or toxic-appearing.  HENT:     Head: Normocephalic.     Right Ear: Hearing, tympanic membrane, ear canal and external ear normal. Tympanic membrane is not erythematous, retracted or bulging.     Left Ear: Hearing, tympanic membrane, ear canal and  external ear normal. Tympanic membrane is not erythematous, retracted or bulging.     Nose: No mucosal edema or rhinorrhea.     Right Sinus: No maxillary sinus tenderness or frontal sinus tenderness.     Left Sinus: No maxillary sinus tenderness or frontal sinus tenderness.     Mouth/Throat:     Pharynx: Uvula midline.  Eyes:     General: Lids are normal. Lids are everted, no foreign bodies appreciated.     Conjunctiva/sclera: Conjunctivae normal.     Pupils: Pupils are equal, round, and reactive to light.  Neck:     Thyroid: No thyroid mass or thyromegaly.     Vascular: No carotid bruit.     Trachea: Trachea normal.  Cardiovascular:     Rate and Rhythm: Normal rate and regular rhythm.  Pulses: Normal pulses.     Heart sounds: Normal heart sounds, S1 normal and S2 normal. No murmur heard.    No friction rub. No gallop.  Pulmonary:     Effort: Pulmonary effort is normal. No tachypnea or respiratory distress.     Breath sounds: Normal breath sounds. No decreased breath sounds, wheezing, rhonchi or rales.  Abdominal:     General: Bowel sounds are normal.     Palpations: Abdomen is soft.     Tenderness: There is no abdominal tenderness.  Musculoskeletal:     Cervical back: Normal range of motion and neck supple.  Skin:    General: Skin is warm and dry.     Findings: No rash.  Neurological:     Mental Status: She is alert. Mental status is at baseline.     Cranial Nerves: Cranial nerves 2-12 are intact.     Sensory: Sensation is intact.     Motor: Weakness, atrophy and abnormal muscle tone present.     Coordination: Coordination abnormal.     Gait: Gait abnormal.     Comments: Nonverbal Right upper extremity paresis, right lower extremity 1/5 strength, except 0/5 in right fott flexion( has brace for foot drop)  Psychiatric:        Mood and Affect: Mood is not anxious or depressed.        Speech: Speech normal.        Behavior: Behavior normal. Behavior is cooperative.         Thought Content: Thought content normal.        Judgment: Judgment normal.    Diabetic foot exam: Normal inspection No skin breakdown No calluses  Normal DP pulses Normal sensation to light touch and monofilament Nails normal     Results for orders placed or performed in visit on 08/04/23  Microalbumin / creatinine urine ratio  Result Value Ref Range   Microalb, Ur 303.2 (H) 0.0 - 1.9 mg/dL   Creatinine,U 664.4 mg/dL   Microalb Creat Ratio 236.4 (H) 0.0 - 30.0 mg/g  Comprehensive metabolic panel  Result Value Ref Range   Sodium 142 135 - 145 mEq/L   Potassium 4.0 3.5 - 5.1 mEq/L   Chloride 108 96 - 112 mEq/L   CO2 24 19 - 32 mEq/L   Glucose, Bld 129 (H) 70 - 99 mg/dL   BUN 26 (H) 6 - 23 mg/dL   Creatinine, Ser 0.34 (H) 0.40 - 1.20 mg/dL   Total Bilirubin 0.4 0.2 - 1.2 mg/dL   Alkaline Phosphatase 71 39 - 117 U/L   AST 13 0 - 37 U/L   ALT 16 0 - 35 U/L   Total Protein 8.3 6.0 - 8.3 g/dL   Albumin 4.3 3.5 - 5.2 g/dL   GFR 74.25 (L) >95.63 mL/min   Calcium 9.9 8.4 - 10.5 mg/dL  Lipid panel  Result Value Ref Range   Cholesterol 147 0 - 200 mg/dL   Triglycerides 875.6 0.0 - 149.0 mg/dL   HDL 43.32 >95.18 mg/dL   VLDL 84.1 0.0 - 66.0 mg/dL   LDL Cholesterol 68 0 - 99 mg/dL   Total CHOL/HDL Ratio 3    NonHDL 93.38     Assessment and Plan The patient's preventative maintenance and recommended screening tests for an annual wellness exam were reviewed in full today. Brought up to date unless services declined.  Counselled on the importance of diet, exercise, and its role in overall health and mortality. The patient's FH and SH was reviewed,  including their home life, tobacco status, and drug and alcohol status.   Vaccines: Will consider shingrix.  Given flu shot and prevnar 20 in office today Pap/DVE:  nml pap and HPV 2015, Not indicated Mammo: Due.. pt refuses  Bone Density: Due..  pt refused. Colon:  Per daughter last one 2015 ish smoking Status: Former  quit 6  years ago... she refuses at this time. 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 Followed by neurology.  Dr. Malvin Johns.  Reviewed last office visit.   Hypertension associated with diabetes [facility]) Assessment & Plan: Chronic, at goal at home   On amlodipine 10 mg p.o. dail, losartan 50 mg p.o. daily, Lopressor 25 mg p.o. twice daily   Type 2 diabetes mellitus with other specified complication, without long-term current use of insulin (HCC) Assessment & Plan: Stable, chronic.  Continue current medication. Encouraged exercise, weight loss, healthy eating habits.  Glucotrol XL 5 mg daily. Metformin held given decreased kidney function  SGLT2 inhibitor given patient incontinent and high risk for perineal infection.   Moderate protein-calorie malnutrition (HCC) Assessment & Plan: Weight stable in the last year.   Hyperlipidemia associated with type 2 diabetes mellitus (HCC) Assessment & Plan: Stable, chronic.  Continue current medication. 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 CKD is progressive. -Continue losartan, pravastatin, aspirin for cardiovascular risk reduction -Avoiding SGLT2 inhibitor because patient is incontinent at night. She is high risk of perineal infections. Therefore we will defer SGLT2 inhibitor for now.    Diabetic nephropathy associated with type 2 diabetes mellitus (HCC) Assessment & Plan:  Chronic, worsening since last OV.  Followed by  nephrology.   Hyperparathyroidism due to renal insufficiency [facility]) Assessment & Plan: Per renal.      No follow-ups on file.   Kerby Nora, MD

## 2023-08-11 NOTE — Assessment & Plan Note (Signed)
Stable, chronic.  Continue current medication. LDL now at goal less than 70 Pravastatin 80 mg daily

## 2023-08-11 NOTE — Assessment & Plan Note (Signed)
Chronic, worsening since last OV.  Followed by nephrology.

## 2023-08-11 NOTE — Assessment & Plan Note (Signed)
Weight stable in the last year.

## 2023-08-11 NOTE — Assessment & Plan Note (Signed)
Chronic, at goal at home   On amlodipine 10 mg p.o. dail, losartan 50 mg p.o. daily, Lopressor 25 mg p.o. twice daily

## 2023-08-11 NOTE — Assessment & Plan Note (Signed)
Per renal 

## 2023-08-11 NOTE — Assessment & Plan Note (Addendum)
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 Followed by neurology.  Dr. Malvin Johns.  Reviewed last office visit.

## 2023-08-11 NOTE — Assessment & Plan Note (Signed)
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 CKD is progressive. -Continue losartan, pravastatin, aspirin for cardiovascular risk reduction -Avoiding SGLT2 inhibitor because patient is incontinent at night. She is high risk of perineal infections. Therefore we will defer SGLT2 inhibitor for now.

## 2023-08-11 NOTE — Assessment & Plan Note (Addendum)
Stable, chronic.  Continue current medication. Encouraged exercise, weight loss, healthy eating habits.  Glucotrol XL 5 mg daily. Metformin held given decreased kidney function  SGLT2 inhibitor given patient incontinent and high risk for perineal infection.

## 2023-09-21 ENCOUNTER — Other Ambulatory Visit: Payer: Self-pay | Admitting: Family Medicine

## 2023-09-21 DIAGNOSIS — I10 Essential (primary) hypertension: Secondary | ICD-10-CM

## 2023-10-14 ENCOUNTER — Other Ambulatory Visit: Payer: Self-pay | Admitting: Family Medicine

## 2023-10-14 DIAGNOSIS — I10 Essential (primary) hypertension: Secondary | ICD-10-CM

## 2023-11-21 ENCOUNTER — Telehealth: Payer: Self-pay | Admitting: Family Medicine

## 2023-11-21 NOTE — Telephone Encounter (Signed)
 Patient daughter Andrea [name] dropped off FMLA ppw that needs to be filled out. She also dropped off last one for reference. Placed in Dr. Ermalene Searing box up front.

## 2023-11-21 NOTE — Telephone Encounter (Signed)
 FMLA paperwork placed in Dr. Daphine Deutscher office in box to complete.    On previous forms we put 2 absence per week and 5 hours per day.  Patient has written in 5 per week and 5 hours per day.  Okay to change?

## 2023-11-23 NOTE — Telephone Encounter (Signed)
 Rasheedah notified that FMLA paperwork in ready to be picked up at the front desk.  I also faxed it in at [phone].

## 2023-12-29 ENCOUNTER — Encounter: Payer: Self-pay | Admitting: Family Medicine

## 2023-12-29 NOTE — Telephone Encounter (Signed)
 More information needed to complete forms, left message with daughter to call our office.

## 2024-01-01 LAB — PROTEIN / CREATININE RATIO, URINE: Creatinine, Urine: 45

## 2024-01-01 LAB — HEMOGLOBIN A1C: Hemoglobin A1C: 7.4

## 2024-01-01 LAB — MICROALBUMIN, URINE: Microalb, Ur: 148.2

## 2024-01-01 LAB — MICROALBUMIN / CREATININE URINE RATIO: Microalb Creat Ratio: 3293

## 2024-01-03 DIAGNOSIS — Z0279 Encounter for issue of other medical certificate: Secondary | ICD-10-CM

## 2024-01-03 NOTE — Telephone Encounter (Signed)
 FMLA for care taker forms received for completion for patient. Patient has been informed that process may take up to 5 business days.  Employer Name [employer]  Reason for being out: Care for mom to help adl and get to appointments.  Any inpatient care: n/a  Any Planned appointment? Yes [facility] 01/08/24 and 01/11/24. EEG and neurology 01/08/24 Patient is requesting start date of 01/08/24 Patient is requesting end date of  on going until further evaluation   Patient is requesting 20 minutes daily to help get mom started for day as well as 2-4 hours weekly for appointments   Verified with patient that it is ok to leave Voicemail updates on  Mobile [phone] (mobile)  Patient would like to pick up copy in our office when form is completed.   Fax number form should be sent to is  [phone]

## 2024-01-03 NOTE — Telephone Encounter (Signed)
 Form placed in your box for review please see below message regarding requested time off from patient daughter. That will be #8 a-d. Not sure how you wanted to approve. Last one you gave 1 time a week for 3 ours but she is also requesting time each morning for getting patient started for the day?

## 2024-01-04 ENCOUNTER — Telehealth: Payer: Self-pay | Admitting: *Deleted

## 2024-01-04 NOTE — Telephone Encounter (Signed)
 Copied from CRM [id number]. Topic: General - Other >> Jan 04, 2024  9:41 AM Elizebeth Brooking wrote: Reason for CRM: Patient Daughter, Jonn Shingles called in wanting to speak with Coralie Common, is requesting hor her to give her a callback in regards to patient FMLA paperwork at [phone]

## 2024-01-04 NOTE — Telephone Encounter (Signed)
 I think you still have form. I have reached out to patient and let know you have approved change and will will contact once all forms ready for pick up.

## 2024-01-04 NOTE — Telephone Encounter (Signed)
 Reached out to the daughter to let her know we would make the requested updates as soon as we received the paperwork back from Dr. Ermalene Searing.

## 2024-01-08 ENCOUNTER — Telehealth: Payer: Self-pay | Admitting: *Deleted

## 2024-01-08 DIAGNOSIS — E1169 Type 2 diabetes mellitus with other specified complication: Secondary | ICD-10-CM

## 2024-01-08 NOTE — Telephone Encounter (Signed)
-----   Message from Alvina Chou sent at 01/08/2024  3:52 PM EDT ----- Regarding: Lab orders for Thur, 5.1.25 Lab orders for a 6 month follow up appt

## 2024-01-08 NOTE — Telephone Encounter (Signed)
 Spoke with the patient's daughter, she will be in today to pick up FMLA paperwork.

## 2024-01-22 ENCOUNTER — Other Ambulatory Visit: Payer: Self-pay | Admitting: Family Medicine

## 2024-01-22 DIAGNOSIS — E785 Hyperlipidemia, unspecified: Secondary | ICD-10-CM

## 2024-02-01 ENCOUNTER — Other Ambulatory Visit: Payer: Medicare Other

## 2024-02-01 ENCOUNTER — Encounter: Payer: Self-pay | Admitting: Family Medicine

## 2024-02-01 DIAGNOSIS — E1169 Type 2 diabetes mellitus with other specified complication: Secondary | ICD-10-CM | POA: Diagnosis not present

## 2024-02-01 DIAGNOSIS — E785 Hyperlipidemia, unspecified: Secondary | ICD-10-CM

## 2024-02-01 LAB — LIPID PANEL
Cholesterol: 148 mg/dL (ref 0–200)
HDL: 48.5 mg/dL (ref >39.00)
LDL Cholesterol: 66 mg/dL (ref 0–99)
NonHDL: 99.53
Total CHOL/HDL Ratio: 3
Triglycerides: 167 mg/dL — ABNORMAL HIGH (ref 0.0–149.0)
VLDL: 33.4 mg/dL (ref 0.0–40.0)

## 2024-02-01 LAB — COMPREHENSIVE METABOLIC PANEL WITH GFR
ALT: 15 U/L (ref 0–35)
AST: 12 U/L (ref 0–37)
Albumin: 4.4 g/dL (ref 3.5–5.2)
Alkaline Phosphatase: 68 U/L (ref 39–117)
BUN: 28 mg/dL — ABNORMAL HIGH (ref 6–23)
CO2: 23 meq/L (ref 19–32)
Calcium: 9.8 mg/dL (ref 8.4–10.5)
Chloride: 107 meq/L (ref 96–112)
Creatinine, Ser: 2.61 mg/dL — ABNORMAL HIGH (ref 0.40–1.20)
GFR: 18.59 mL/min — ABNORMAL LOW (ref >60.00)
Glucose, Bld: 162 mg/dL — ABNORMAL HIGH (ref 70–99)
Potassium: 4.4 meq/L (ref 3.5–5.1)
Sodium: 140 meq/L (ref 135–145)
Total Bilirubin: 0.6 mg/dL (ref 0.2–1.2)
Total Protein: 8.6 g/dL — ABNORMAL HIGH (ref 6.0–8.3)

## 2024-02-08 ENCOUNTER — Ambulatory Visit: Payer: Medicare Other | Admitting: Family Medicine

## 2024-02-08 ENCOUNTER — Ambulatory Visit: Admitting: Family Medicine

## 2024-02-08 ENCOUNTER — Encounter: Payer: Self-pay | Admitting: Family Medicine

## 2024-02-08 VITALS — BP 147/75 | HR 70 | Temp 97.5°F | Wt 144.2 lb

## 2024-02-08 DIAGNOSIS — E1169 Type 2 diabetes mellitus with other specified complication: Secondary | ICD-10-CM

## 2024-02-08 DIAGNOSIS — Z7984 Long term (current) use of oral hypoglycemic drugs: Secondary | ICD-10-CM

## 2024-02-08 DIAGNOSIS — I152 Hypertension secondary to endocrine disorders: Secondary | ICD-10-CM | POA: Diagnosis not present

## 2024-02-08 DIAGNOSIS — E785 Hyperlipidemia, unspecified: Secondary | ICD-10-CM

## 2024-02-08 DIAGNOSIS — E1159 Type 2 diabetes mellitus with other circulatory complications: Secondary | ICD-10-CM

## 2024-02-08 NOTE — Assessment & Plan Note (Addendum)
 Chronic, at goal at home Her daughter will continue to follow at home and will let me know if blood pressure persistently elevated especially if above 150/90.  If it is we will increase losartan  to 100 mg daily.   On amlodipine  10 mg p.o. dail, losartan  50 mg p.o. daily, Lopressor  25 mg p.o. twice daily

## 2024-02-08 NOTE — Progress Notes (Signed)
 Patient ID: Andrea [name], female    DOB: Oct 14, 1955, 68 y.o.   MRN: [id number]  This visit was conducted in person.  BP (!) 147/75   Pulse 70   Temp (!) 97.5 F (36.4 C) (Temporal)   Wt 144 lb 4 oz (65.4 kg)   SpO2 97%   BMI 23.28 kg/m    CC:  Chief Complaint  Patient presents with   Diabetes    Subjective:   HPI: Andrea [name] is a 68 y.o. female presenting on 02/08/2024 for Diabetes  She is here with her daughter.Terrilee Few...  primary guardian  Ms. 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  Share care of mother. Mom  needs  help preparing food, she is able to feed herslef and Nees a little help dressing and toileting.  Some issues with chewing at times. Needs med administration  Can walk on short distances with quad carne, otherwise in wheelchair.  Uses bedside commode. Uses depends and bed pad for bladder incontinence.   Followed by Neurology Dr. Walden Guise.   Hypertension: Slightly above goal in office today.  On amlodipine  10 mg p.o. dail, losartan  50 mg p.o. daily, Lopressor  25 mg p.o. twice daily...  BP Readings from Last 3 Encounters:  02/08/24 (!) 147/75  08/11/23 (!) 140/72  05/09/23 138/60  Using medication without problems or lightheadedness:  none Chest pain with exertion: none Edema: some in right leg.. they use compression pump. Short of breath: none Average home BPs: 136-147/70-80 Other issues:    Diabetes: Well-controlled  gluctorol Xl 5 mg daily  , associated with CKD Lab Results  Component Value Date   HGBA1C 7.4 01/01/2024  Using medications without difficulties: Hypoglycemic episodes: none Hyperglycemic episodes: none Feet problems:  toenail grown back  Blood Sugars averaging: 107-120 eye exam within last year: yes  Has been snacking, changed to glucerna.  Elevated Cholesterol: LDL at goal on pravastatin  80 mg daily, history of stroke goal LDL less  than 70 Lab Results  Component Value Date   CHOL 148 02/01/2024   HDL 48.50 02/01/2024   LDLCALC 66 02/01/2024   TRIG 167.0 (H) 02/01/2024   CHOLHDL 3 02/01/2024  Using medications without problems: Muscle aches:  Diet compliance: inadequate. Exercise: none Other complaints:  Wt Readings from Last 3 Encounters:  02/08/24 144 lb 4 oz (65.4 kg)  08/11/23 145 lb 4 oz (65.9 kg)  07/11/23 144 lb (65.3 kg)  Body mass index is 23.28 kg/m.    Relevant past medical, surgical, family and social history reviewed and updated as indicated. Interim medical history since our last visit reviewed. Allergies and medications reviewed and updated. Outpatient Medications Prior to Visit  Medication Sig Dispense Refill   amLODipine  (NORVASC ) 10 MG tablet Take 1 tablet by mouth once daily 90 tablet 1   aspirin  EC 81 MG tablet Take 81 mg by mouth daily.     Cholecalciferol 25 MCG (1000 UT) tablet Take 2,000 Units by mouth daily.     glipiZIDE  (GLUCOTROL  XL) 5 MG 24 hr tablet Take 1 tablet by mouth once daily with breakfast 90 tablet 1   Glucerna (GLUCERNA) LIQD Take 1 Can by mouth daily with breakfast.     losartan  (COZAAR ) 50 MG tablet Take 1 tablet by mouth once daily 90 tablet 1   metoprolol  tartrate (LOPRESSOR ) 25 MG tablet Take 1 tablet by mouth twice daily 180 tablet 3  OVER THE COUNTER MEDICATION OMEGA 3 FISH OIL. 2000 MG. 1 DAILY     pravastatin  (PRAVACHOL ) 80 MG tablet Take 1 tablet by mouth once daily 90 tablet 1   Probiotic Product (PROBIOTIC-10 PO) Take by mouth. 1 tablet daily     senna (SENOKOT) 8.6 MG tablet Take 1 tablet by mouth daily as needed.     feeding supplement (ENSURE ENLIVE / ENSURE PLUS) LIQD Take 237 mLs by mouth 3 (three) times daily between meals. 237 mL 12   polyethylene glycol (MIRALAX  / GLYCOLAX ) 17 g packet Take 17 g by mouth 2 (two) times daily as needed. 14 each 0   No facility-administered medications prior to visit.     Per HPI unless specifically indicated  in ROS section below Review of Systems  Constitutional:  Negative for fatigue, fever and unexpected weight change.  HENT:  Negative for congestion, ear pain, sinus pressure, sneezing, sore throat and trouble swallowing.   Eyes:  Negative for pain and itching.  Respiratory:  Negative for cough, shortness of breath and wheezing.   Cardiovascular:  Negative for chest pain, palpitations and leg swelling.  Gastrointestinal:  Negative for abdominal pain, blood in stool, constipation, diarrhea and nausea.  Genitourinary:  Negative for difficulty urinating, dysuria, hematuria, menstrual problem, vaginal bleeding and vaginal discharge.  Musculoskeletal:  Negative for back pain.  Skin:  Negative for rash.  Neurological:  Negative for syncope, weakness, light-headedness, numbness and headaches.  Psychiatric/Behavioral:  Negative for confusion and dysphoric mood. The patient is not nervous/anxious.    Objective:  BP (!) 147/75   Pulse 70   Temp (!) 97.5 F (36.4 C) (Temporal)   Wt 144 lb 4 oz (65.4 kg)   SpO2 97%   BMI 23.28 kg/m   Wt Readings from Last 3 Encounters:  02/08/24 144 lb 4 oz (65.4 kg)  08/11/23 145 lb 4 oz (65.9 kg)  07/11/23 144 lb (65.3 kg)      Physical Exam Constitutional:      General: She is awake. She is not in acute distress.    Appearance: Normal appearance. She is well-developed. She is not ill-appearing or toxic-appearing.  HENT:     Head: Normocephalic.     Right Ear: Hearing, tympanic membrane, ear canal and external ear normal. Tympanic membrane is not erythematous, retracted or bulging.     Left Ear: Hearing, tympanic membrane, ear canal and external ear normal. Tympanic membrane is not erythematous, retracted or bulging.     Nose: No mucosal edema or rhinorrhea.     Right Sinus: No maxillary sinus tenderness or frontal sinus tenderness.     Left Sinus: No maxillary sinus tenderness or frontal sinus tenderness.     Mouth/Throat:     Pharynx: Uvula midline.   Eyes:     General: Lids are normal. Lids are everted, no foreign bodies appreciated.     Conjunctiva/sclera: Conjunctivae normal.     Pupils: Pupils are equal, round, and reactive to light.  Neck:     Thyroid: No thyroid mass or thyromegaly.     Vascular: No carotid bruit.     Trachea: Trachea normal.  Cardiovascular:     Rate and Rhythm: Normal rate and regular rhythm.     Pulses: Normal pulses.     Heart sounds: Normal heart sounds, S1 normal and S2 normal. No murmur heard.    No friction rub. No gallop.  Pulmonary:     Effort: Pulmonary effort is normal. No tachypnea or respiratory distress.  Breath sounds: Normal breath sounds. No decreased breath sounds, wheezing, rhonchi or rales.  Abdominal:     General: Bowel sounds are normal.     Palpations: Abdomen is soft.     Tenderness: There is no abdominal tenderness.  Musculoskeletal:     Cervical back: Normal range of motion and neck supple.  Skin:    General: Skin is warm and dry.     Findings: No rash.  Neurological:     Mental Status: She is alert. Mental status is at baseline.     Cranial Nerves: Cranial nerves 2-12 are intact.     Sensory: Sensation is intact.     Motor: Weakness, atrophy and abnormal muscle tone present.     Coordination: Coordination abnormal.     Gait: Gait abnormal.     Comments: Nonverbal Right upper extremity paresis, right lower extremity 1/5 strength, except 0/5 in right fott flexion( has brace for foot drop)  Psychiatric:        Mood and Affect: Mood is not anxious or depressed.        Speech: Speech normal.        Behavior: Behavior normal. Behavior is cooperative.        Thought Content: Thought content normal.        Judgment: Judgment normal.         Results for orders placed or performed in visit on 02/01/24  Comprehensive metabolic panel with GFR   Collection Time: 02/01/24  8:34 AM  Result Value Ref Range   Sodium 140 135 - 145 mEq/L   Potassium 4.4 3.5 - 5.1 mEq/L    Chloride 107 96 - 112 mEq/L   CO2 23 19 - 32 mEq/L   Glucose, Bld 162 (H) 70 - 99 mg/dL   BUN 28 (H) 6 - 23 mg/dL   Creatinine, Ser 1.91 (H) 0.40 - 1.20 mg/dL   Total Bilirubin 0.6 0.2 - 1.2 mg/dL   Alkaline Phosphatase 68 39 - 117 U/L   AST 12 0 - 37 U/L   ALT 15 0 - 35 U/L   Total Protein 8.6 (H) 6.0 - 8.3 g/dL   Albumin 4.4 3.5 - 5.2 g/dL   GFR 47.82 (L) >95.62 mL/min   Calcium 9.8 8.4 - 10.5 mg/dL  Lipid panel   Collection Time: 02/01/24  8:34 AM  Result Value Ref Range   Cholesterol 148 0 - 200 mg/dL   Triglycerides 130.8 (H) 0.0 - 149.0 mg/dL   HDL 65.78 >46.96 mg/dL   VLDL 29.5 0.0 - 28.4 mg/dL   LDL Cholesterol 66 0 - 99 mg/dL   Total CHOL/HDL Ratio 3    NonHDL 99.53     Assessment and Plan      Hypertension associated with diabetes [facility]) Assessment & Plan: Chronic, at goal at home Her daughter will continue to follow at home and will let me know if blood pressure persistently elevated especially if above 150/90.  If it is we will increase losartan  to 100 mg daily.   On amlodipine  10 mg p.o. dail, losartan  50 mg p.o. daily, Lopressor  25 mg p.o. twice daily   Type 2 diabetes mellitus with other specified complication, without long-term current use of insulin  (HCC) Assessment & Plan: Stable, chronic.  Continue current medication. Encouraged exercise, weight loss, healthy eating habits.  Ms. Reever does not stick to a low-carb diet and freestyle libre or other continuous glucose monitor but Ms. Abercrombie is adamant she does not want to use this.  Carb diet given activity is limited. Recheck A1c in 3 months.  If rising A1c will increase Glucotrol  XL to 10 mg daily.  Glucotrol  XL 5 mg daily. Metformin  held given decreased kidney function  Avoid SGLT2 inhibitor given patient incontinent and high risk for perineal infection.   Hyperlipidemia associated with type 2 diabetes mellitus (HCC) Assessment & Plan: Stable, chronic.  Continue current medication. LDL now at goal  less than 70 Pravastatin  80 mg daily       Return in about 3 months (around 05/10/2024) for diabetes follow up poc A1C.   Herby Lolling, MD

## 2024-02-08 NOTE — Assessment & Plan Note (Signed)
 Stable, chronic.  Continue current medication. LDL now at goal less than 70 Pravastatin 80 mg daily

## 2024-02-08 NOTE — Assessment & Plan Note (Addendum)
 Stable, chronic.  Continue current medication. Encouraged exercise, weight loss, healthy eating habits.  Andrea [name] does not stick to a low-carb diet and freestyle libre or other continuous glucose monitor but Andrea [name] is adamant she does not want to use this.  Carb diet given activity is limited. Recheck A1c in 3 months.  If rising A1c will increase Glucotrol  XL to 10 mg daily.  Glucotrol  XL 5 mg daily. Metformin  held given decreased kidney function  Avoid SGLT2 inhibitor given patient incontinent and high risk for perineal infection.

## 2024-03-06 ENCOUNTER — Other Ambulatory Visit: Payer: Self-pay | Admitting: Family Medicine

## 2024-03-06 DIAGNOSIS — I10 Essential (primary) hypertension: Secondary | ICD-10-CM

## 2024-03-20 ENCOUNTER — Other Ambulatory Visit: Payer: Self-pay | Admitting: Family Medicine

## 2024-03-20 DIAGNOSIS — I10 Essential (primary) hypertension: Secondary | ICD-10-CM

## 2024-05-02 ENCOUNTER — Other Ambulatory Visit

## 2024-05-02 ENCOUNTER — Encounter: Payer: Self-pay | Admitting: Family Medicine

## 2024-05-10 ENCOUNTER — Ambulatory Visit: Admitting: Family Medicine

## 2024-05-10 VITALS — BP 140/76 | HR 67 | Temp 97.7°F

## 2024-05-10 DIAGNOSIS — I152 Hypertension secondary to endocrine disorders: Secondary | ICD-10-CM

## 2024-05-10 DIAGNOSIS — E1159 Type 2 diabetes mellitus with other circulatory complications: Secondary | ICD-10-CM | POA: Diagnosis not present

## 2024-05-10 DIAGNOSIS — I10 Essential (primary) hypertension: Secondary | ICD-10-CM

## 2024-05-10 DIAGNOSIS — E1169 Type 2 diabetes mellitus with other specified complication: Secondary | ICD-10-CM

## 2024-05-10 LAB — POCT GLYCOSYLATED HEMOGLOBIN (HGB A1C): Hemoglobin A1C: 6.7 % — AB (ref 4.0–5.6)

## 2024-05-10 MED ORDER — LOSARTAN POTASSIUM 100 MG PO TABS: 100.0000 mg | ORAL_TABLET | Freq: Every day | ORAL | 1 refills | Status: DC

## 2024-05-10 NOTE — Assessment & Plan Note (Addendum)
 Chronic,  inadequate control at home and in office.     On amlodipine  10 mg p.o. dail,  INCREASE losartan  to 100 mg p.o. daily, Lopressor  25 mg p.o. twice daily   Follow BP at home.

## 2024-05-10 NOTE — Progress Notes (Signed)
 Patient ID: Andrea [name], female    DOB: 09/13/1956, 68 y.o.   MRN: [id number]  This visit was conducted in person.  BP (!) 140/76   Pulse 67   Temp 97.7 F (36.5 C) (Temporal)   SpO2 98%    CC:  Chief Complaint  Patient presents with   Diabetes    Subjective:   HPI: Andrea [name] is a 68 y.o. female presenting on 05/10/2024 for Diabetes  She is here with her daughter.Andrea [name]...  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  Share care of mother. Mom  needs  help preparing food, she is able to feed herslef and Nees a little help dressing and toileting.  Some issues with chewing at times. Needs med administration  Can walk on short distances with quad carne, otherwise in wheelchair.  Uses bedside commode. Uses depends and bed pad for bladder incontinence.   Followed by Neurology Dr. Lane.   Hypertension: Above goal in office today as well as at home per daughter.  On amlodipine  10 mg p.o. dail, losartan  50 mg p.o. daily, Lopressor  25 mg p.o. twice daily...  BP Readings from Last 3 Encounters:  05/10/24 (!) 140/76  02/08/24 (!) 147/75  08/11/23 (!) 140/72  Using medication without problems or lightheadedness:  none Chest pain with exertion: none Edema: some in right leg.. they use compression pump. Short of breath: none Average home BPs:  138/76-150/90 before meds Other issues:    Diabetes: Well-controlled  gluctorol Xl 5 mg daily  , associated with CKD Lab Results  Component Value Date   HGBA1C 6.7 (A) 05/10/2024  Using medications without difficulties: Hypoglycemic episodes: none Hyperglycemic episodes: none Feet problems:  toenail grown back  Blood Sugars averaging: 107-120 eye exam within last year: yes    Wt Readings from Last 3 Encounters:  02/08/24 144 lb 4 oz (65.4 kg)  08/11/23 145 lb 4 oz (65.9 kg)  07/11/23 144 lb (65.3 kg)  There  is no height or weight on file to calculate BMI.    Relevant past medical, surgical, family and social history reviewed and updated as indicated. Interim medical history since our last visit reviewed. Allergies and medications reviewed and updated. Outpatient Medications Prior to Visit  Medication Sig Dispense Refill   amLODipine  (NORVASC ) 10 MG tablet Take 1 tablet by mouth once daily 90 tablet 1   aspirin  EC 81 MG tablet Take 81 mg by mouth daily.     Cholecalciferol 25 MCG (1000 UT) tablet Take 2,000 Units by mouth daily.     glipiZIDE  (GLUCOTROL  XL) 5 MG 24 hr tablet Take 1 tablet by mouth once daily with breakfast 90 tablet 1   Glucerna (GLUCERNA) LIQD Take 1 Can by mouth daily with breakfast.     metoprolol  tartrate (LOPRESSOR ) 25 MG tablet Take 1 tablet by mouth twice daily 180 tablet 3   OVER THE COUNTER MEDICATION OMEGA 3 FISH OIL. 2000 MG. 1 DAILY     pravastatin  (PRAVACHOL ) 80 MG tablet Take 1 tablet by mouth once daily 90 tablet 1   Probiotic Product (PROBIOTIC-10 PO) Take by mouth. 1 tablet daily     senna (SENOKOT) 8.6 MG tablet Take 1 tablet by mouth daily as needed.     losartan  (COZAAR ) 50 MG tablet Take 1 tablet by mouth once daily 90 tablet 0   No facility-administered medications prior to visit.  Per HPI unless specifically indicated in ROS section below Review of Systems  Constitutional:  Negative for fatigue, fever and unexpected weight change.  HENT:  Negative for congestion, ear pain, sinus pressure, sneezing, sore throat and trouble swallowing.   Eyes:  Negative for pain and itching.  Respiratory:  Negative for cough, shortness of breath and wheezing.   Cardiovascular:  Negative for chest pain, palpitations and leg swelling.  Gastrointestinal:  Negative for abdominal pain, blood in stool, constipation, diarrhea and nausea.  Genitourinary:  Negative for difficulty urinating, dysuria, hematuria, menstrual problem, vaginal bleeding and vaginal discharge.   Musculoskeletal:  Negative for back pain.  Skin:  Negative for rash.  Neurological:  Negative for syncope, weakness, light-headedness, numbness and headaches.  Psychiatric/Behavioral:  Negative for confusion and dysphoric mood. The patient is not nervous/anxious.    Objective:  BP (!) 140/76   Pulse 67   Temp 97.7 F (36.5 C) (Temporal)   SpO2 98%   Wt Readings from Last 3 Encounters:  02/08/24 144 lb 4 oz (65.4 kg)  08/11/23 145 lb 4 oz (65.9 kg)  07/11/23 144 lb (65.3 kg)      Physical Exam Constitutional:      General: She is awake. She is not in acute distress.    Appearance: Normal appearance. She is well-developed. She is not ill-appearing or toxic-appearing.  HENT:     Head: Normocephalic.     Right Ear: Hearing, tympanic membrane, ear canal and external ear normal. Tympanic membrane is not erythematous, retracted or bulging.     Left Ear: Hearing, tympanic membrane, ear canal and external ear normal. Tympanic membrane is not erythematous, retracted or bulging.     Nose: No mucosal edema or rhinorrhea.     Right Sinus: No maxillary sinus tenderness or frontal sinus tenderness.     Left Sinus: No maxillary sinus tenderness or frontal sinus tenderness.     Mouth/Throat:     Pharynx: Uvula midline.  Eyes:     General: Lids are normal. Lids are everted, no foreign bodies appreciated.     Conjunctiva/sclera: Conjunctivae normal.     Pupils: Pupils are equal, round, and reactive to light.  Neck:     Thyroid: No thyroid mass or thyromegaly.     Vascular: No carotid bruit.     Trachea: Trachea normal.  Cardiovascular:     Rate and Rhythm: Normal rate and regular rhythm.     Pulses: Normal pulses.     Heart sounds: Normal heart sounds, S1 normal and S2 normal. No murmur heard.    No friction rub. No gallop.  Pulmonary:     Effort: Pulmonary effort is normal. No tachypnea or respiratory distress.     Breath sounds: Normal breath sounds. No decreased breath sounds,  wheezing, rhonchi or rales.  Abdominal:     General: Bowel sounds are normal.     Palpations: Abdomen is soft.     Tenderness: There is no abdominal tenderness.  Musculoskeletal:     Cervical back: Normal range of motion and neck supple.  Skin:    General: Skin is warm and dry.     Findings: No rash.  Neurological:     Mental Status: She is alert. Mental status is at baseline.     Cranial Nerves: Cranial nerves 2-12 are intact.     Sensory: Sensation is intact.     Motor: Weakness, atrophy and abnormal muscle tone present.     Coordination: Coordination abnormal.  Gait: Gait abnormal.     Comments: Nonverbal Right upper extremity paresis, right lower extremity 1/5 strength, except 0/5 in right fott flexion( has brace for foot drop)  Psychiatric:        Mood and Affect: Mood is not anxious or depressed.        Speech: Speech normal.        Behavior: Behavior normal. Behavior is cooperative.        Thought Content: Thought content normal.        Judgment: Judgment normal.         Results for orders placed or performed in visit on 05/10/24  POCT glycosylated hemoglobin (Hb A1C)   Collection Time: 05/10/24 10:19 AM  Result Value Ref Range   Hemoglobin A1C 6.7 (A) 4.0 - 5.6 %   HbA1c POC (<> result, manual entry)     HbA1c, POC (prediabetic range)     HbA1c, POC (controlled diabetic range)      Assessment and Plan      Type 2 diabetes mellitus with other specified complication, without long-term current use of insulin  (HCC) -     POCT glycosylated hemoglobin (Hb A1C)  Hypertension associated with diabetes (HCC) Assessment & Plan: Chronic,  inadequate control at home and in office.     On amlodipine  10 mg p.o. dail,  INCREASE losartan  to 100 mg p.o. daily, Lopressor  25 mg p.o. twice daily   Follow BP at home.   Essential hypertension -     Losartan  Potassium; Take 1 tablet (100 mg total) by mouth daily.  Dispense: 90 tablet; Refill: 1      Return in about  6 months (around 11/10/2024) for diabetes follow up with fastong labs prior.   Greig Ring, MD

## 2024-07-11 ENCOUNTER — Ambulatory Visit: Payer: Medicare Other

## 2024-07-11 VITALS — Ht 66.0 in | Wt 144.0 lb

## 2024-07-11 DIAGNOSIS — Z0000 Encounter for general adult medical examination without abnormal findings: Secondary | ICD-10-CM

## 2024-07-11 NOTE — Patient Instructions (Signed)
 Ms. Schippers,  Thank you for taking the time for your Medicare Wellness Visit. I appreciate your continued commitment to your health goals. Please review the care plan we discussed, and feel free to reach out if I can assist you further.  Medicare recommends these wellness visits once per year to help you and your care team stay ahead of potential health issues. These visits are designed to focus on prevention, allowing your provider to concentrate on managing your acute and chronic conditions during your regular appointments.  Please note that Annual Wellness Visits do not include a physical exam. Some assessments may be limited, especially if the visit was conducted virtually. If needed, we may recommend a separate in-person follow-up with your provider.  Ongoing Care Seeing your primary care provider every 3 to 6 months helps us  monitor your health and provide consistent, personalized care.   Referrals If a referral was made during today's visit and you haven't received any updates within two weeks, please contact the referred provider directly to check on the status.  Recommended Screenings:  Health Maintenance  Topic Date Due   Breast Cancer Screening  Never done   Colon Cancer Screening  Never done   Zoster (Shingles) Vaccine (1 of 2) 10/12/2007   DEXA scan (bone density measurement)  Never done   Eye exam for diabetics  04/19/2024   COVID-19 Vaccine (1 - 2025-26 season) Never done   Medicare Annual Wellness Visit  07/10/2024   Flu Shot  12/31/2024*   Complete foot exam   08/10/2024   Hemoglobin A1C  11/10/2024   Yearly kidney health urinalysis for diabetes  12/31/2024   Yearly kidney function blood test for diabetes  01/31/2025   DTaP/Tdap/Td vaccine (2 - Td or Tdap) 05/12/2026   Pneumococcal Vaccine for age over 10  Completed   Hepatitis C Screening  Completed   Meningitis B Vaccine  Aged Out  *Topic was postponed. The date shown is not the original due date.        07/11/2023    3:12 PM  Advanced Directives  Does Patient Have a Medical Advance Directive? No  Would patient like information on creating a medical advance directive? Yes (MAU/Ambulatory/Procedural Areas - Information given)   Advance Care Planning is important because it: Ensures you receive medical care that aligns with your values, goals, and preferences. Provides guidance to your family and loved ones, reducing the emotional burden of decision-making during critical moments.  Vision: Annual vision screenings are recommended for early detection of glaucoma, cataracts, and diabetic retinopathy. These exams can also reveal signs of chronic conditions such as diabetes and high blood pressure.  Dental: Annual dental screenings help detect early signs of oral cancer, gum disease, and other conditions linked to overall health, including heart disease and diabetes.  Please see the attached documents for additional preventive care recommendations.

## 2024-07-11 NOTE — Progress Notes (Signed)
 Subjective:   Andrea [name] is a 68 y.o. who presents for a Medicare Wellness preventive visit.  As a reminder, Annual Wellness Visits don't include a physical exam, and some assessments may be limited, especially if this visit is performed virtually. We may recommend an in-person follow-up visit with your provider if needed.  Visit Complete: Virtual I connected with  Andrea [name] on 07/11/24 by a audio enabled telemedicine application and verified that I am speaking with the correct person using two identifiers.  Patient Location: Home  Provider Location: Office/Clinic  I discussed the limitations of evaluation and management by telemedicine. The patient expressed understanding and agreed to proceed.  Vital Signs: Because this visit was a virtual/telehealth visit, some criteria may be missing or patient reported. Any vitals not documented were not able to be obtained and vitals that have been documented are patient reported.  VideoDeclined- This patient declined Librarian, academic. Therefore the visit was completed with audio only.  Persons Participating in Visit: Patient assisted by daughter, Andrea [name].  AWV Questionnaire: Yes: Patient Medicare AWV questionnaire was completed by the patient on 07/11/24; I have confirmed that all information answered by patient is correct and no changes since this date.  Cardiac Risk Factors include: advanced age (>27men, >31 women);diabetes mellitus;hypertension;sedentary lifestyle;dyslipidemia   Objective:    Today's Vitals   07/11/24 1014  Weight: 144 lb (65.3 kg)  Height: 5' 6 (1.676 m)   Body mass index is 23.24 kg/m.     07/11/2024   10:25 AM 07/11/2023    3:12 PM 06/24/2022    9:39 AM 05/23/2022    5:12 PM 06/19/2021   11:36 AM 10/30/2020   12:27 PM  Advanced Directives  Does Patient Have a Medical Advance Directive? No No No No Yes No  Type of Theatre manager of  Healthcare Power of Attorney in Chart?     Yes - validated most recent copy scanned in chart (See row information)   Would patient like information on creating a medical advance directive?  Yes (MAU/Ambulatory/Procedural Areas - Information given) Yes (MAU/Ambulatory/Procedural Areas - Information given)   No - Patient declined    Current Medications (verified) Outpatient Encounter Medications as of 07/11/2024  Medication Sig   amLODipine  (NORVASC ) 10 MG tablet Take 1 tablet by mouth once daily   aspirin  EC 81 MG tablet Take 81 mg by mouth daily.   Cholecalciferol 25 MCG (1000 UT) tablet Take 2,000 Units by mouth daily.   glipiZIDE  (GLUCOTROL  XL) 5 MG 24 hr tablet Take 1 tablet by mouth once daily with breakfast   Glucerna (GLUCERNA) LIQD Take 1 Can by mouth daily with breakfast.   losartan  (COZAAR ) 100 MG tablet Take 1 tablet (100 mg total) by mouth daily.   metoprolol  tartrate (LOPRESSOR ) 25 MG tablet Take 1 tablet by mouth twice daily   OVER THE COUNTER MEDICATION OMEGA 3 FISH OIL. 2000 MG. 1 DAILY   pravastatin  (PRAVACHOL ) 80 MG tablet Take 1 tablet by mouth once daily   Probiotic Product (PROBIOTIC-10 PO) Take by mouth. 1 tablet daily   senna (SENOKOT) 8.6 MG tablet Take 1 tablet by mouth daily as needed.   No facility-administered encounter medications on file as of 07/11/2024.    Allergies (verified) Citric acid and Citrus   History: Past Medical History:  Diagnosis Date   Stroke [facility])    Past Surgical History:  Procedure Laterality Date   BREAST BIOPSY  Left    CESAREAN SECTION     4    CRANIOPLASTY Left 08/08/2017   CRANIOTOMY FOR HEMISPHERECTOMY TOTAL / PARTIAL Left 02/16/2017   PATELLA FRACTURE SURGERY Right    PERCUTANEOUS ENDOSCOPIC GASTROSTOMY (PEG) REMOVAL     TUBAL LIGATION     Family History  Problem Relation Age of Onset   Stroke Mother    Diabetes Mother    Hypertension Mother    Aneurysm Father    Diabetes Sister    Hypertension Sister    Cancer  Sister        breast   Diabetes Sister    Hypertension Sister    Hypertension Sister    Hypertension Sister    Social History   Socioeconomic History   Marital status: Widowed    Spouse name: Not on file   Number of children: 4   Years of education: Master's degree   Highest education level: Master's degree (e.g., MA, MS, MEng, MEd, MSW, MBA)  Occupational History   Not on file  Tobacco Use   Smoking status: Former    Current packs/day: 0.00    Average packs/day: 0.3 packs/day for 30.0 years (7.5 ttl pk-yrs)    Types: Cigarettes    Start date: 12/1986    Quit date: 12/2016    Years since quitting: 7.6   Smokeless tobacco: Never  Vaping Use   Vaping status: Never Used  Substance and Sexual Activity   Alcohol use: Never   Drug use: Never   Sexual activity: Not Currently  Other Topics Concern   Not on file  Social History Narrative   Lives with Andrea [name] (Daughter), also with Andrea [name]'s daughter, and her other daughter Andrea [name]) and children   Just moved to the area from [place], [place]         Social Drivers of Corporate investment banker Strain: Low Risk  (07/10/2024)   Overall Financial Resource Strain (CARDIA)    Difficulty of Paying Living Expenses: Not very hard  Food Insecurity: No Food Insecurity (07/10/2024)   Hunger Vital Sign    Worried About Running Out of Food in the Last Year: Never true    Ran Out of Food in the Last Year: Never true  Transportation Needs: No Transportation Needs (07/10/2024)   PRAPARE - Administrator, Civil Service (Medical): No    Lack of Transportation (Non-Medical): No  Physical Activity: Inactive (07/10/2024)   Exercise Vital Sign    Days of Exercise per Week: 0 days    Minutes of Exercise per Session: Not on file  Stress: No Stress Concern Present (07/10/2024)   [employer] of Occupational Health - Occupational Stress Questionnaire    Feeling of Stress: Not at all  Social Connections: Socially  Isolated (07/10/2024)   Social Connection and Isolation Panel    Frequency of Communication with Friends and Family: Once a week    Frequency of Social Gatherings with Friends and Family: More than three times a week    Attends Religious Services: Never    Database administrator or Organizations: No    Attends Banker Meetings: Not on file    Marital Status: Widowed    Tobacco Counseling Counseling given: Not Answered    Clinical Intake:  Pre-visit preparation completed: Yes  Pain : No/denies pain     BMI - recorded: 23.24 Nutritional Status: BMI of 19-24  Normal Nutritional Risks: None Diabetes: Yes  Lab Results  Component Value Date  HGBA1C 6.7 (A) 05/10/2024   HGBA1C 7.4 01/01/2024   HGBA1C 6.9 (A) 05/09/2023     How often do you need to have someone help you when you read instructions, pamphlets, or other written materials from your doctor or pharmacy?: 1 - Never  Interpreter Needed?: No  Comments: lives with daughter Information entered by :: Andrea Larocca,LPN   Activities of Daily Living     07/11/2024    8:49 AM  In your present state of health, do you have any difficulty performing the following activities:  Hearing? 0  Vision? 0  Difficulty concentrating or making decisions? 1  Walking or climbing stairs? 1  Dressing or bathing? 1  Doing errands, shopping? 1  Preparing Food and eating ? Y  Using the Toilet? Y  In the past six months, have you accidently leaked urine? Y  Do you have problems with loss of bowel control? N  Managing your Medications? Y  Managing your Finances? Y  Housekeeping or managing your Housekeeping? Y    Patient Care Team: Avelina Greig BRAVO, MD as PCP - General (Family Medicine) [facility], [facility] (Optometry)  I have updated your Care Teams any recent Medical Services you may have received from other providers in the past year.     Assessment:   This is a routine wellness examination for  Andrea [name].  Hearing/Vision screen Hearing Screening - Comments:: Patient denies any hearing difficulties.   Vision Screening - Comments:: Pt says their vision is good with glasses [facility]-not UTD-have to schedule appt   Goals Addressed             This Visit's Progress    Patient Stated   On track    07/11/24-Family would like to get her to do more activity     Patient Stated       Would like to drink more water  and do exercisews       Depression Screen     07/11/2024   10:23 AM 07/11/2023    3:10 PM 02/02/2023    3:43 PM 11/04/2022    4:07 PM 06/24/2022   10:08 AM 06/19/2021   11:20 AM 08/31/2020    2:37 PM  PHQ 2/9 Scores  PHQ - 2 Score 0 0 0 0 0 0 0  PHQ- 9 Score   0 0       Fall Risk     07/11/2024    8:49 AM 02/08/2024    9:48 AM 07/11/2023    3:09 PM 07/11/2023    2:56 PM 02/02/2023    3:44 PM  Fall Risk   Falls in the past year? 0 0 0 0 Exclusion - non ambulatory  Number falls in past yr:  0 0    Injury with Fall?  0 0    Risk for fall due to : Orthopedic patient;Impaired mobility;Impaired balance/gait Impaired mobility No Fall Risks    Follow up Education provided;Falls prevention discussed Falls evaluation completed Falls prevention discussed      MEDICARE RISK AT HOME:  Medicare Risk at Home Any stairs in or around the home?: (Patient-Rptd) Yes If so, are there any without handrails?: (Patient-Rptd) No Home free of loose throw rugs in walkways, pet beds, electrical cords, etc?: (Patient-Rptd) Yes Adequate lighting in your home to reduce risk of falls?: (Patient-Rptd) Yes Life alert?: (Patient-Rptd) No Use of a cane, walker or w/c?: (Patient-Rptd) Yes Grab bars in the bathroom?: (Patient-Rptd) No Shower chair or bench in shower?: (Patient-Rptd) Yes  Elevated toilet seat or a handicapped toilet?: (Patient-Rptd) Yes  TIMED UP AND GO:  Was the test performed?  No  Cognitive Function: Unable: Due to language barrier, hearing or vision limitations or  other-    07/11/2024   10:34 AM  MMSE - Mini Mental State Exam  Not completed: Unable to complete        07/11/2023    3:12 PM 06/19/2021   11:21 AM  6CIT Screen  What Year? 4 points 0 points  What month? 0 points 0 points  What time? 3 points 3 points  Count back from 20 0 points 4 points  Months in reverse 4 points 4 points  Repeat phrase 6 points 10 points  Total Score 17 points 21 points    Immunizations Immunization History  Administered Date(s) Administered   Fluad Trivalent(High Dose 65+) 08/11/2023   Influenza, Quadrivalent, Recombinant, Inj, Pf 08/10/2017   Influenza,inj,Quad PF,6+ Mos 11/10/2020, 06/24/2022   MMR 04/22/1996, 03/24/1998   PNEUMOCOCCAL CONJUGATE-20 08/11/2023   Pneumococcal Polysaccharide-23 11/10/2020   Tdap 05/12/2016   Zoster, Live 05/14/2016    Screening Tests Health Maintenance  Topic Date Due   Mammogram  Never done   Colonoscopy  Never done   Zoster Vaccines- Shingrix (1 of 2) 10/12/2007   DEXA SCAN  Never done   OPHTHALMOLOGY EXAM  04/19/2024   COVID-19 Vaccine (1 - 2025-26 season) Never done   Medicare Annual Wellness (AWV)  07/10/2024   Influenza Vaccine  12/31/2024 (Originally 05/03/2024)   FOOT EXAM  08/10/2024   HEMOGLOBIN A1C  11/10/2024   Diabetic kidney evaluation - Urine ACR  12/31/2024   Diabetic kidney evaluation - eGFR measurement  01/31/2025   DTaP/Tdap/Td (2 - Td or Tdap) 05/12/2026   Pneumococcal Vaccine: 50+ Years  Completed   Hepatitis C Screening  Completed   Meningococcal B Vaccine  Aged Out    Health Maintenance Items Addressed: Daughter says pt has declined vaccines, MMG, DEXA,colonoscopy since stroke in 2019 (inable to do some of these)  Additional Screening:  Vision Screening: Recommended annual ophthalmology exams for early detection of glaucoma and other disorders of the eye. Is the patient up to date with their annual eye exam?  No  Who is the provider or what is the name of the office in which the  patient attends annual eye exams? [facility]-daughter will schedule  Dental Screening: Recommended annual dental exams for proper oral hygiene  Community Resource Referral / Chronic Care Management: CRR required this visit?  No   CCM required this visit?  No   Plan:    I have personally reviewed and noted the following in the patient's chart:   Medical and social history Use of alcohol, tobacco or illicit drugs  Current medications and supplements including opioid prescriptions. Patient is not currently taking opioid prescriptions. Functional ability and status Nutritional status Physical activity Advanced directives List of other physicians Hospitalizations, surgeries, and ER visits in previous 12 months Vitals Screenings to include cognitive, depression, and falls Referrals and appointments  In addition, I have reviewed and discussed with patient certain preventive protocols, quality metrics, and best practice recommendations. A written personalized care plan for preventive services as well as general preventive health recommendations were provided to patient.   Erminio LITTIE Saris, LPN   89/[id number]/7974   After Visit Summary: (MyChart) Due to this being a telephonic visit, the after visit summary with patients personalized plan was offered to patient via MyChart   Notes: Nothing significant to report  at this time.

## 2024-07-18 ENCOUNTER — Other Ambulatory Visit: Payer: Self-pay | Admitting: Family Medicine

## 2024-07-18 DIAGNOSIS — E785 Hyperlipidemia, unspecified: Secondary | ICD-10-CM

## 2024-08-28 ENCOUNTER — Other Ambulatory Visit (INDEPENDENT_AMBULATORY_CARE_PROVIDER_SITE_OTHER): Payer: Self-pay | Admitting: Nurse Practitioner

## 2024-08-28 DIAGNOSIS — N184 Chronic kidney disease, stage 4 (severe): Secondary | ICD-10-CM

## 2024-09-04 ENCOUNTER — Other Ambulatory Visit (INDEPENDENT_AMBULATORY_CARE_PROVIDER_SITE_OTHER)

## 2024-09-04 ENCOUNTER — Encounter (INDEPENDENT_AMBULATORY_CARE_PROVIDER_SITE_OTHER): Payer: Self-pay | Admitting: Nurse Practitioner

## 2024-09-04 ENCOUNTER — Ambulatory Visit (INDEPENDENT_AMBULATORY_CARE_PROVIDER_SITE_OTHER): Admitting: Nurse Practitioner

## 2024-09-04 ENCOUNTER — Other Ambulatory Visit: Payer: Self-pay | Admitting: Family Medicine

## 2024-09-04 VITALS — BP 175/91 | HR 74 | Resp 17 | Ht 66.0 in | Wt 132.4 lb

## 2024-09-04 DIAGNOSIS — I10 Essential (primary) hypertension: Secondary | ICD-10-CM

## 2024-09-04 DIAGNOSIS — N184 Chronic kidney disease, stage 4 (severe): Secondary | ICD-10-CM

## 2024-09-08 ENCOUNTER — Encounter (INDEPENDENT_AMBULATORY_CARE_PROVIDER_SITE_OTHER): Payer: Self-pay | Admitting: Nurse Practitioner

## 2024-09-08 NOTE — Progress Notes (Signed)
 Subjective:    Patient ID: Andrea [name], female    DOB: [birth year], 68 y.o.   MRN: [id number] Chief Complaint  Patient presents with   New Patient (Initial Visit)    NP. v-map/UE art/consult. CKD4. singh    HPI  Discussed the use of AI scribe software for clinical note transcription with the patient, who gave verbal consent to proceed.  History of Present Illness Andrea [name] is a 68 year old female with chronic kidney disease who presents for evaluation of vascular access for dialysis. She is accompanied by her [name].  Her [name] provides much of the history and information as the patient has had a previous stroke and does not speak much.  She was referred by Dr. Dennise for evaluation of her readiness for dialysis.  She is considering an arteriovenous (AV) graft in her left arm due to borderline vein size for a fistula. Vein mapping revealed small veins in her left arm. She is considering arm access over peritoneal dialysis due to concerns about infection and difficulty with bathroom use at night.  Her current kidney function is at an estimated glomerular filtration rate (eGFR) of 16. No swelling in her legs or shortness of breath.  No signs or symptoms of uremia currently.    Results RADIOLOGY Vein mapping ultrasound: Left upper extremity vein diameter is small for placement of an AV fistula, brachial axillary graft recommended.  Right upper extremity not measured due to contraction.   Review of Systems  Neurological:  Positive for weakness.  All other systems reviewed and are negative.      Objective:   Physical Exam Vitals reviewed.  HENT:     Head: Normocephalic.  Cardiovascular:     Rate and Rhythm: Normal rate.     Pulses:          Radial pulses are 2+ on the left side.  Pulmonary:     Effort: Pulmonary effort is normal.  Skin:    General: Skin is warm and dry.  Neurological:     Mental Status: She is alert. Mental status is at baseline.     Motor: Weakness  present.     Gait: Gait abnormal.  Psychiatric:        Mood and Affect: Mood normal.        Behavior: Behavior normal.        Thought Content: Thought content normal.        Judgment: Judgment normal.     Physical Exam CARDIOVASCULAR: Peripheral pulses strong, hands warm.  BP (!) 175/91   Pulse 74   Resp 17   Ht 5' 6 (1.676 m)   Wt 132 lb 6.4 oz (60.1 kg)   BMI 21.37 kg/m   Past Medical History:  Diagnosis Date   Stroke [facility])     Social History   Socioeconomic History   Marital status: Widowed    Spouse name: Not on file   Number of children: 4   Years of education: Master's degree   Highest education level: Master's degree (e.g., MA, MS, MEng, MEd, MSW, MBA)  Occupational History   Not on file  Tobacco Use   Smoking status: Former    Current packs/day: 0.00    Average packs/day: 0.3 packs/day for 30.0 years (7.5 ttl pk-yrs)    Types: Cigarettes    Start date: 12/1986    Quit date: 12/2016    Years since quitting: 7.7   Smokeless tobacco: Never  Vaping Use   Vaping status: Never  Used  Substance and Sexual Activity   Alcohol use: Never   Drug use: Never   Sexual activity: Not Currently  Other Topics Concern   Not on file  Social History Narrative   Lives with Andrea [name] ([name]), also with Andrea [name], and her other [name] Andrea [name]) and children   Just moved to the area from [place], [place]         Social Drivers of Corporate Investment Banker Strain: Low Risk  (07/10/2024)   Overall Financial Resource Strain (CARDIA)    Difficulty of Paying Living Expenses: Not very hard  Food Insecurity: No Food Insecurity (07/10/2024)   Hunger Vital Sign    Worried About Running Out of Food in the Last Year: Never true    Ran Out of Food in the Last Year: Never true  Transportation Needs: No Transportation Needs (07/10/2024)   PRAPARE - Administrator, Civil Service (Medical): No    Lack of Transportation (Non-Medical): No   Physical Activity: Inactive (07/10/2024)   Exercise Vital Sign    Days of Exercise per Week: 0 days    Minutes of Exercise per Session: Not on file  Stress: No Stress Concern Present (07/10/2024)   [employer] of Occupational Health - Occupational Stress Questionnaire    Feeling of Stress: Not at all  Social Connections: Socially Isolated (07/10/2024)   Social Connection and Isolation Panel    Frequency of Communication with Friends and Family: Once a week    Frequency of Social Gatherings with Friends and Family: More than three times a week    Attends Religious Services: Never    Database Administrator or Organizations: No    Attends Banker Meetings: Not on file    Marital Status: Widowed  Intimate Partner Violence: Not At Risk (07/11/2024)   Humiliation, Afraid, Rape, and Kick questionnaire    Fear of Current or Ex-Partner: No    Emotionally Abused: No    Physically Abused: No    Sexually Abused: No    Past Surgical History:  Procedure Laterality Date   BREAST BIOPSY Left    CESAREAN SECTION     4    CRANIOPLASTY Left 08/08/2017   CRANIOTOMY FOR HEMISPHERECTOMY TOTAL / PARTIAL Left 02/16/2017   PATELLA FRACTURE SURGERY Right    PERCUTANEOUS ENDOSCOPIC GASTROSTOMY (PEG) REMOVAL     TUBAL LIGATION      Family History  Problem Relation Age of Onset   Stroke Mother    Diabetes Mother    Hypertension Mother    Aneurysm Father    Diabetes Sister    Hypertension Sister    Cancer Sister        breast   Diabetes Sister    Hypertension Sister    Hypertension Sister    Hypertension Sister     Allergies  Allergen Reactions   Citric Acid Hives   Citrus Hives       Latest Ref Rng & Units 01/27/2023    9:19 AM 06/24/2022   10:19 AM 06/17/2021    4:29 PM  CBC  WBC 4.0 - 10.5 K/uL 9.6  6.9  7.5   Hemoglobin 12.0 - 15.0 g/dL 87.5  88.9  87.4   Hematocrit 36.0 - 46.0 % 37.1  33.9  38.7   Platelets 150.0 - 400.0 K/uL 369.0  386.0  322.0        CMP     Component Value Date/Time   NA 140 02/01/2024 0834  NA 142 03/21/2018 0000   K 4.4 02/01/2024 0834   CL 107 02/01/2024 0834   CO2 23 02/01/2024 0834   GLUCOSE 162 (H) 02/01/2024 0834   BUN 28 (H) 02/01/2024 0834   BUN 12 03/21/2018 0000   CREATININE 2.61 (H) 02/01/2024 0834   CALCIUM 9.8 02/01/2024 0834   PROT 8.6 (H) 02/01/2024 0834   ALBUMIN 4.4 02/01/2024 0834   AST 12 02/01/2024 0834   ALT 15 02/01/2024 0834   ALKPHOS 68 02/01/2024 0834   BILITOT 0.6 02/01/2024 0834   GFR 18.59 (L) 02/01/2024 0834   GFRNONAA 42 (L) 11/04/2020 0722     No results found.     Assessment & Plan:   1. CKD (chronic kidney disease) stage 4, GFR 15-29 ml/min (HCC) (Primary) Planning for brachial axillary arteriovenous graft placement for hemodialysis access -eGFR of 16  -necessitates AV graft due to small veins in the left arm. Graft matures in 4 weeks, less infection risk than fistula. Clotting risk requires blood pressure management. Day surgery expected with minor swelling and soreness. Hand numbness or coldness indicates complications. - Contacted Dr. Dennise to confirm timing for graft placement.  Based on his recommendation we will wait until after her follow-up in February before moving forward with any placement.  They are advised to contact her office for placement, if she wishes to move forward with AV graft versus peritoneal dialysis. - Avoid blood pressure measurements, blood draws, and IVs on the left arm post-graft placement. - Monitor for graft complications: excessive swelling, severe pain, hand numbness.      Current Outpatient Medications on File Prior to Visit  Medication Sig Dispense Refill   aspirin  EC 81 MG tablet Take 81 mg by mouth daily.     Cholecalciferol 25 MCG (1000 UT) tablet Take 2,000 Units by mouth daily.     glipiZIDE  (GLUCOTROL  XL) 5 MG 24 hr tablet Take 1 tablet by mouth once daily with breakfast 90 tablet 0   Glucerna (GLUCERNA) LIQD Take  1 Can by mouth daily with breakfast.     losartan  (COZAAR ) 100 MG tablet Take 1 tablet (100 mg total) by mouth daily. 90 tablet 1   metoprolol  tartrate (LOPRESSOR ) 25 MG tablet Take 1 tablet by mouth twice daily 180 tablet 3   OVER THE COUNTER MEDICATION OMEGA 3 FISH OIL. 2000 MG. 1 DAILY     pravastatin  (PRAVACHOL ) 80 MG tablet Take 1 tablet by mouth once daily 90 tablet 0   Probiotic Product (PROBIOTIC-10 PO) Take by mouth. 1 tablet daily     senna (SENOKOT) 8.6 MG tablet Take 1 tablet by mouth daily as needed.     No current facility-administered medications on file prior to visit.    There are no Patient Instructions on file for this visit. No follow-ups on file.   Alizaya Oshea E Abrish Erny, NP

## 2024-10-08 ENCOUNTER — Telehealth: Payer: Self-pay | Admitting: Family Medicine

## 2024-10-08 DIAGNOSIS — Z0279 Encounter for issue of other medical certificate: Secondary | ICD-10-CM

## 2024-10-08 NOTE — Telephone Encounter (Signed)
 Left message to return call to office. Need to follow up regarding FMLA/disability forms.

## 2024-10-08 NOTE — Telephone Encounter (Signed)
 FMLA forms received for patient's daughter, Wanita Glatter.  Will fax to [facility] at [phone] when complete  Pt will pick up at the front desk when completed.  Forms placed in providers box for review.

## 2024-10-08 NOTE — Telephone Encounter (Signed)
 FMLA [employer] dropped off  placed in [facility] Specialist box for completion    [phone] to call once completed

## 2024-10-09 NOTE — Telephone Encounter (Signed)
 Completed forms received and faxed to [facility] at [phone]  Copy sent to scan  Copy for daughter left at the front desk to pick up.  Daughter notified via phone call.

## 2024-10-13 ENCOUNTER — Other Ambulatory Visit: Payer: Self-pay | Admitting: Family Medicine

## 2024-10-13 DIAGNOSIS — I10 Essential (primary) hypertension: Secondary | ICD-10-CM

## 2024-10-13 DIAGNOSIS — E785 Hyperlipidemia, unspecified: Secondary | ICD-10-CM

## 2024-10-24 ENCOUNTER — Telehealth: Payer: Self-pay | Admitting: *Deleted

## 2024-10-24 DIAGNOSIS — E1169 Type 2 diabetes mellitus with other specified complication: Secondary | ICD-10-CM

## 2024-10-24 NOTE — Telephone Encounter (Signed)
-----   Message from Harlene Du sent at 10/23/2024  3:04 PM EST ----- Regarding: Lab Tues 11/05/24 Fasting labs prior to diabetes f/u visit. Can we get orders please.

## 2024-11-05 ENCOUNTER — Other Ambulatory Visit

## 2024-11-12 ENCOUNTER — Ambulatory Visit: Admitting: Family Medicine

## 2024-11-12 ENCOUNTER — Other Ambulatory Visit

## 2025-07-15 ENCOUNTER — Ambulatory Visit
